# Patient Record
Sex: Male | Born: 1955 | Race: White | Hispanic: No | Marital: Married | Smoking: Former smoker
Health system: Southern US, Academic
[De-identification: ages and names within clinical notes are randomized; demographics above are authoritative.]

## PROBLEM LIST (undated history)

## (undated) DIAGNOSIS — N429 Disorder of prostate, unspecified: Secondary | ICD-10-CM

## (undated) DIAGNOSIS — M5136 Other intervertebral disc degeneration, lumbar region: Secondary | ICD-10-CM

## (undated) DIAGNOSIS — Z789 Other specified health status: Secondary | ICD-10-CM

## (undated) DIAGNOSIS — M51369 Other intervertebral disc degeneration, lumbar region without mention of lumbar back pain or lower extremity pain: Secondary | ICD-10-CM

## (undated) HISTORY — DX: Other intervertebral disc degeneration, lumbar region without mention of lumbar back pain or lower extremity pain: M51.369

## (undated) HISTORY — DX: Other intervertebral disc degeneration, lumbar region: M51.36

## (undated) HISTORY — PX: NECK SURGERY: SHX720

## (undated) HISTORY — DX: Disorder of prostate, unspecified: N42.9

---

## 1991-02-25 ENCOUNTER — Emergency Department (HOSPITAL_COMMUNITY): Payer: Self-pay

## 2022-01-04 ENCOUNTER — Encounter (INDEPENDENT_AMBULATORY_CARE_PROVIDER_SITE_OTHER): Payer: Self-pay | Admitting: Surgery

## 2022-03-01 ENCOUNTER — Ambulatory Visit (HOSPITAL_COMMUNITY): Payer: Self-pay | Admitting: PAIN MANAGEMENT

## 2022-04-05 ENCOUNTER — Emergency Department (HOSPITAL_BASED_OUTPATIENT_CLINIC_OR_DEPARTMENT_OTHER): Payer: Medicare (Managed Care)

## 2022-04-05 ENCOUNTER — Emergency Department
Admission: EM | Admit: 2022-04-05 | Discharge: 2022-04-05 | Disposition: A | Discharge status: Home / Self Care | Payer: Medicare (Managed Care) | Attending: Emergency Medicine | Admitting: Emergency Medicine

## 2022-04-05 ENCOUNTER — Emergency Department (EMERGENCY_DEPARTMENT_HOSPITAL): Payer: Medicare (Managed Care)

## 2022-04-05 ENCOUNTER — Other Ambulatory Visit: Payer: Self-pay

## 2022-04-05 ENCOUNTER — Encounter (HOSPITAL_BASED_OUTPATIENT_CLINIC_OR_DEPARTMENT_OTHER): Payer: Self-pay

## 2022-04-05 DIAGNOSIS — M47812 Spondylosis without myelopathy or radiculopathy, cervical region: Secondary | ICD-10-CM | POA: Insufficient documentation

## 2022-04-05 DIAGNOSIS — Z23 Encounter for immunization: Secondary | ICD-10-CM

## 2022-04-05 DIAGNOSIS — W19XXXA Unspecified fall, initial encounter: Secondary | ICD-10-CM

## 2022-04-05 DIAGNOSIS — S0003XA Contusion of scalp, initial encounter: Secondary | ICD-10-CM | POA: Insufficient documentation

## 2022-04-05 DIAGNOSIS — W010XXA Fall on same level from slipping, tripping and stumbling without subsequent striking against object, initial encounter: Secondary | ICD-10-CM | POA: Insufficient documentation

## 2022-04-05 DIAGNOSIS — S61012A Laceration without foreign body of left thumb without damage to nail, initial encounter: Secondary | ICD-10-CM

## 2022-04-05 DIAGNOSIS — S0990XA Unspecified injury of head, initial encounter: Secondary | ICD-10-CM

## 2022-04-05 DIAGNOSIS — M79642 Pain in left hand: Secondary | ICD-10-CM

## 2022-04-05 DIAGNOSIS — S161XXA Strain of muscle, fascia and tendon at neck level, initial encounter: Secondary | ICD-10-CM

## 2022-04-05 DIAGNOSIS — S63105A Unspecified dislocation of left thumb, initial encounter: Secondary | ICD-10-CM

## 2022-04-05 DIAGNOSIS — S39012A Strain of muscle, fascia and tendon of lower back, initial encounter: Secondary | ICD-10-CM

## 2022-04-05 HISTORY — DX: Other specified health status: Z78.9

## 2022-04-05 MED ORDER — SODIUM CHLORIDE 0.9 % INTRAVENOUS PIGGYBACK
INJECTION | INTRAVENOUS | Status: AC
Start: 2022-04-05 — End: 2022-04-05
  Filled 2022-04-05: qty 50

## 2022-04-05 MED ORDER — CYCLOBENZAPRINE 10 MG TABLET
10.0000 mg | ORAL_TABLET | Freq: Three times a day (TID) | ORAL | 0 refills | Status: DC | PRN
Start: 2022-04-05 — End: 2022-04-18

## 2022-04-05 MED ORDER — CEFAZOLIN 1 GRAM SOLUTION FOR INJECTION
INTRAMUSCULAR | Status: AC
Start: 2022-04-05 — End: 2022-04-05
  Filled 2022-04-05: qty 10

## 2022-04-05 MED ORDER — MORPHINE 4 MG/ML INJECTION WRAPPER
4.0000 mg | INJECTION | INTRAMUSCULAR | Status: AC
Start: 2022-04-05 — End: 2022-04-05
  Administered 2022-04-05: 4 mg via INTRAVENOUS

## 2022-04-05 MED ORDER — KETOROLAC 30 MG/ML (1 ML) INJECTION SOLUTION
30.0000 mg | INTRAMUSCULAR | Status: AC
Start: 2022-04-05 — End: 2022-04-05
  Administered 2022-04-05: 30 mg via INTRAVENOUS

## 2022-04-05 MED ORDER — TRAMADOL 50 MG TABLET
1.0000 | ORAL_TABLET | Freq: Four times a day (QID) | ORAL | 0 refills | Status: DC | PRN
Start: 2022-04-05 — End: 2022-04-18

## 2022-04-05 MED ORDER — METHYLPREDNISOLONE ACETATE 40 MG/ML SUSPENSION FOR INJECTION
80.0000 mg | Freq: Once | INTRAMUSCULAR | Status: AC
Start: 2022-04-05 — End: 2022-04-05
  Administered 2022-04-05: 80 mg via INTRAMUSCULAR

## 2022-04-05 MED ORDER — METHYLPREDNISOLONE ACETATE 40 MG/ML SUSPENSION FOR INJECTION
INTRAMUSCULAR | Status: AC
Start: 2022-04-05 — End: 2022-04-05
  Filled 2022-04-05: qty 2

## 2022-04-05 MED ORDER — KETOROLAC 30 MG/ML (1 ML) INJECTION SOLUTION
INTRAMUSCULAR | Status: AC
Start: 2022-04-05 — End: 2022-04-05
  Filled 2022-04-05: qty 1

## 2022-04-05 MED ORDER — DIPHTH,PERTUSSIS(ACEL),TETANUS 2.5 LF UNIT-8 MCG-5 LF/0.5ML IM SYRINGE
INJECTION | INTRAMUSCULAR | Status: AC
Start: 2022-04-05 — End: 2022-04-05
  Filled 2022-04-05: qty 0.5

## 2022-04-05 MED ORDER — BACITRACIN 500 UNIT/G OINTMENT TUBE
TOPICAL_OINTMENT | CUTANEOUS | Status: AC
Start: 2022-04-05 — End: 2022-04-05
  Filled 2022-04-05: qty 28.4

## 2022-04-05 MED ORDER — DIPHTH,PERTUSSIS(ACEL),TETANUS 2.5 LF UNIT-8 MCG-5 LF/0.5ML IM SYRINGE
0.5000 mL | INJECTION | INTRAMUSCULAR | Status: AC
Start: 2022-04-05 — End: 2022-04-05
  Administered 2022-04-05: 0.5 mL via INTRAMUSCULAR

## 2022-04-05 MED ORDER — LIDOCAINE HCL 20 MG/ML (2 %) INJECTION SOLUTION
INTRAMUSCULAR | Status: AC
Start: 2022-04-05 — End: 2022-04-05
  Filled 2022-04-05: qty 20

## 2022-04-05 MED ORDER — SODIUM CHLORIDE 0.9 % INTRAVENOUS PIGGYBACK
1.0000 g | INTRAVENOUS | Status: AC
Start: 2022-04-05 — End: 2022-04-05
  Administered 2022-04-05: 0 g via INTRAVENOUS
  Administered 2022-04-05: 1 g via INTRAVENOUS

## 2022-04-05 MED ORDER — KETOROLAC 10 MG TABLET
10.0000 mg | ORAL_TABLET | Freq: Four times a day (QID) | ORAL | 0 refills | Status: DC | PRN
Start: 2022-04-05 — End: 2022-04-18

## 2022-04-05 MED ORDER — MORPHINE 4 MG/ML INJECTION WRAPPER
INJECTION | INTRAMUSCULAR | Status: AC
Start: 2022-04-05 — End: 2022-04-05
  Filled 2022-04-05: qty 1

## 2022-04-05 MED ORDER — CEPHALEXIN 500 MG CAPSULE
500.0000 mg | ORAL_CAPSULE | Freq: Four times a day (QID) | ORAL | 0 refills | Status: AC
Start: 2022-04-05 — End: 2022-04-15

## 2022-04-05 MED ORDER — LIDOCAINE HCL 10 MG/ML (1 %) INJECTION SOLUTION
INTRAMUSCULAR | Status: AC
Start: 2022-04-05 — End: 2022-04-05
  Filled 2022-04-05: qty 20

## 2022-04-05 MED ORDER — SODIUM CHLORIDE 0.9 % INTRAVENOUS SOLUTION
INTRAVENOUS | Status: DC
Start: 2022-04-05 — End: 2022-04-05
  Administered 2022-04-05: 0 mL via INTRAVENOUS

## 2022-04-05 MED ORDER — CYCLOBENZAPRINE 10 MG TABLET
10.0000 mg | ORAL_TABLET | ORAL | Status: AC
Start: 2022-04-05 — End: 2022-04-05
  Administered 2022-04-05: 10 mg via ORAL

## 2022-04-05 MED ORDER — CYCLOBENZAPRINE 10 MG TABLET
ORAL_TABLET | ORAL | Status: AC
Start: 2022-04-05 — End: 2022-04-05
  Filled 2022-04-05: qty 1

## 2022-04-05 NOTE — ED Nurses Note (Signed)
Discharge instructions given to and went over with patient and wife. Understanding stated. Patient reports feeling better than when he came in. Ambulated off unit without problem accompanied by wife.

## 2022-04-05 NOTE — ED Provider Notes (Signed)
[facility], [facility] Emergency Department  ED Primary Provider Note  History of Present Illness   Chief Complaint   Patient presents with    Brandon Fuller is a 66 y.o. male who had concerns including Fall.  Arrival: The patient arrived by Car complaining of tripping and falling backwards hitting his head.  He is complaining of severe headache as well as neck pain.  He denies any numbness or tingling in the extremity.  Denies any loss of consciousness.  No blurred or double vision.  He states he also has no movement in his left thumb.  He said the tip of the thumb bent all way backwards and when he got up off the ground he pushed it back.  He is unable to flex the left thumb.  Patient also is sore all over.  He states he has had back trouble in his lower back for years and has been going throat a chiropractor as well as a pain clinic in [place].    HPI  Review of Systems   Review of Systems   Constitutional:  Positive for activity change and appetite change. Negative for chills and fever.   HENT:  Negative for ear pain and sore throat.    Eyes:  Negative for pain and visual disturbance.   Respiratory:  Negative for cough and shortness of breath.    Cardiovascular:  Negative for chest pain and palpitations.   Gastrointestinal:  Negative for abdominal pain and vomiting.   Genitourinary:  Negative for dysuria and hematuria.   Musculoskeletal:  Positive for arthralgias, back pain, joint swelling and myalgias.   Skin:  Positive for wound. Negative for color change and rash.   Neurological:  Positive for dizziness and headaches. Negative for seizures and syncope.   All other systems reviewed and are negative.     Historical Data   History Reviewed This Encounter:     Physical Exam   ED Triage Vitals [04/05/22 0837]   BP (Non-Invasive) 102/67   Heart Rate 60   Respiratory Rate 20   Temperature (!) 35.9 C (96.7 F)   SpO2 97 %   Weight 83.5 kg (184 lb)   Height 1.803 m ('5\' 11"'$ )      Physical Exam  Vitals and nursing note reviewed.   Constitutional:       General: He is not in acute distress.     Appearance: He is well-developed. He is obese.   HENT:      Head: Normocephalic.      Comments: Positive tenderness with mild swelling to the occiput.     Right Ear: External ear normal.      Left Ear: External ear normal.      Nose: Nose normal.      Mouth/Throat:      Mouth: Mucous membranes are moist.   Eyes:      Extraocular Movements: Extraocular movements intact.      Conjunctiva/sclera: Conjunctivae normal.      Pupils: Pupils are equal, round, and reactive to light.   Neck:      Comments: Positive tenderness over C5-C6.  No swelling or ecchymosis.  Cardiovascular:      Rate and Rhythm: Normal rate and regular rhythm.      Pulses: Normal pulses.      Heart sounds: Normal heart sounds. No murmur heard.  Pulmonary:      Effort: Pulmonary effort is normal. No respiratory distress.  Breath sounds: Normal breath sounds.   Abdominal:      General: Bowel sounds are normal.      Palpations: Abdomen is soft.      Tenderness: There is no abdominal tenderness.   Musculoskeletal:         General: Swelling, tenderness, deformity and signs of injury present.      Cervical back: Neck supple. Tenderness present.      Comments: Positive swelling of the thumb at the D IP with laceration just below of 2 cm.  Patient is unable to flex the distal phalanx.  No numbness or tingling in the tip.   Skin:     General: Skin is warm and dry.      Capillary Refill: Capillary refill takes less than 2 seconds.   Neurological:      General: No focal deficit present.      Mental Status: He is alert and oriented to person, place, and time.   Psychiatric:         Mood and Affect: Mood normal.         Behavior: Behavior normal.         Thought Content: Thought content normal.         Judgment: Judgment normal.       Patient Data   Labs Ordered/Reviewed - No data to display  XR HAND LEFT   Final Result by Edi, Radresults In  (11/06 1250)   Interval reduction and splinting of the first interphalangeal joint.                Radiologist location ID: [id number]         CT BRAIN WO IV CONTRAST   Final Result by Edi, Radresults In (11/06 1028)   NO ACUTE FINDINGS         One or more dose reduction techniques were used (e.g., Automated exposure control, adjustment of the mA and/or kV according to patient size, use of iterative reconstruction technique).         Radiologist location ID: [id number]         CT CERVICAL SPINE WO IV CONTRAST   Final Result by Edi, Radresults In (11/06 1031)   NO ACUTE CERVICAL FRACTURE.  DEGENERATIVE CHANGES.         One or more dose reduction techniques were used (e.g., Automated exposure control, adjustment of the mA and/or kV according to patient size, use of iterative reconstruction technique).         Radiologist location ID: [id number]         XR HAND LEFT   Final Result by Edi, Radresults In (11/06 0926)   A dislocation of the interphalangeal joint of the left thumb is noted. No definite associated fracture is seen.         Radiologist location ID: [facility] Decision [facility]        Medical Decision Making  Patient is 66 year old white male who fell backwards hitting his head.  He is complaining of pain to the back of the head as well as neck.  Patient also  bent his tip of his left thumb backwards and is unable to flex the distal tip.  Also has a laceration on the palmar aspect of the D IP left thumb.  Patient has no flexor movement of the distal phalanx on the thumb.  Patient will have a CT scan of his head neck.  Will also have  an x-ray of his left hand.  I will then talk to a hand surgeon concerning a ruptured flexor tendon of the thumb on the left.  Patient meantime will have tetanus shot as well as pain relief and an antibiotic for the open thumb.    Amount and/or Complexity of Data Reviewed  Radiology: ordered.    Risk  Prescription drug management.  Parenteral controlled  substances.    Lac Repair    Date/Time: 04/05/2022 12:06 PM    Performed by: Ammie Ferrier, MD  Authorized by: Ammie Ferrier, MD    Consent:     Consent obtained:  Verbal    Consent given by:  Patient    Risks, benefits, and alternatives were discussed: yes      Risks discussed:  Infection, pain, retained foreign body and need for additional repair    Alternatives discussed:  No treatment  Universal protocol:     Procedure explained and questions answered to patient or proxy's satisfaction: yes      Relevant documents present and verified: yes      Test results available: yes      Imaging studies available: yes      Required blood products, implants, devices, and special equipment available: yes      Site/side marked: yes      Immediately prior to procedure, a time out was called: yes      Patient identity confirmed:  Hospital-assigned identification number, verbally with patient and arm band  Anesthesia:     Anesthesia method:  Local infiltration    Local anesthetic:  Lidocaine 1% w/o epi  Laceration details:     Location:  Finger    Finger location:  L thumb    Length (cm):  3    Depth (mm):  1  Pre-procedure details:     Preparation:  Patient was prepped and draped in usual sterile fashion and imaging obtained to evaluate for foreign bodies  Exploration:     Limited defect created (wound extended): yes      Hemostasis achieved with:  Epinephrine    Imaging obtained: x-ray      Imaging outcome: foreign body not noted      Wound exploration: wound explored through full range of motion and entire depth of wound visualized    Treatment:     Area cleansed with:  Povidone-iodine    Amount of cleaning:  Standard    Irrigation solution:  Sterile saline    Irrigation method:  Syringe    Visualized foreign bodies/material removed: no      Debridement:  None    Undermining:  None    Scar revision: no    Skin repair:     Repair method:  Sutures    Suture size:  4-0    Suture material:  Nylon    Suture technique:  Simple  interrupted    Number of sutures:  7  Approximation:     Approximation:  Close  Repair type:     Repair type:  Simple  Post-procedure details:     Dressing:  Antibiotic ointment, sterile dressing and splint for protection    Procedure completion:  Tolerated well, no immediate complications  Upper Ext Dislocation    Date/Time: 04/05/2022 12:10 PM    Performed by: Ammie Ferrier, MD  Authorized by: Ammie Ferrier, MD    Consent:     Consent obtained:  Verbal    Consent given by:  Patient  Risks, benefits, and alternatives were discussed: yes      Risks discussed:  Restricted joint movement, fracture, nerve damage and recurrent dislocation    Alternatives discussed:  No treatment  Universal protocol:     Procedure explained and questions answered to patient or proxy's satisfaction: yes      Relevant documents present and verified: yes      Test results available: yes      Imaging studies available: yes      Required blood products, implants, devices, and special equipment available: yes      Site/side marked: yes      Immediately prior to procedure, a time out was called: yes      Patient identity confirmed:  Verbally with patient, hospital-assigned identification number and arm band  Location:     Location:  Finger    Finger location:  L thumb    Finger dislocation type: DIP    Pre-procedure details:     Pre-procedure imaging:  X-ray    Imaging findings: dislocation present      Distal perfusion: diminished    Sedation:     Sedation type:  None  Anesthesia:     Anesthesia method:  Local infiltration    Local anesthetic:  Lidocaine 1% w/o epi  Procedure details:     Manipulation performed: yes      Finger reduction method:  Direct traction    Reduction successful: yes      Reduction confirmed with imaging: yes      Immobilization:  Splint    Supplies used:  Aluminum splint  Post-procedure details:     Neurological function: normal      Distal perfusion: normal      Range of motion: improved      Procedure completion:   Tolerated well, no immediate complications               Medications Administered in the ED   NS premix infusion (0 mL Intravenous Stopped 04/05/22 1324)   diphtheria, pertussis-acell, tetanus (BOOSTRIX) IM injection (0.5 mL IntraMUSCULAR Given 04/05/22 0935)   ceFAZolin (ANCEF) 1 g in NS 50 mL IVPB minibag (0 g Intravenous Stopped 04/05/22 0953)   morphine 4 mg/mL injection (4 mg Intravenous Given 04/05/22 0932)   ketorolac (TORADOL) 30 mg/mL injection (30 mg Intravenous Given 04/05/22 1316)   cyclobenzaprine (FLEXERIL) tablet (10 mg Oral Given 04/05/22 1315)   methylPREDNISolone acetate (DEPO-medrol) 40 mg/mL injection (80 mg IntraMUSCULAR Given 04/05/22 1318)     Clinical Impression   Dislocation of left thumb (Primary)   Laceration of left thumb   Cervical strain, acute   Closed head injury   Contusion of occipital region of scalp   Lumbosacral strain, initial encounter       Disposition: Discharged               Clinical Impression   Dislocation of left thumb (Primary)   Laceration of left thumb   Cervical strain, acute   Closed head injury   Contusion of occipital region of scalp   Lumbosacral strain, initial encounter       Discharge Medication List as of 04/05/2022 12:43 PM        START taking these medications    Details   cephalexin (KEFLEX) 500 mg Oral Capsule Take 1 Capsule (500 mg total) by mouth Four times a day for 10 days, Disp-40 Capsule, R-0, E-Rx      cyclobenzaprine (FLEXERIL) 10 mg Oral Tablet Take 1 Tablet (  10 mg total) by mouth Three times a day as needed for Muscle spasms, Disp-12 Tablet, R-0, E-Rx      ketorolac tromethamine (TORADOL) 10 mg Oral Tablet Take 1 Tablet (10 mg total) by mouth Every 6 hours as needed for Pain, Disp-20 Tablet, R-0, DAW, E-Rx      traMADoL (ULTRAM) 50 mg Oral Tablet Take 1 Tablet (50 mg total) by mouth Every 6 hours as needed for Pain, Disp-20 Tablet, R-0, E-Rx

## 2022-04-05 NOTE — ED Triage Notes (Signed)
Fell backward on an bank  hit back of head no LOC  and landed on back on concrete  also bent Lt thumb backward  broken???  Also has a laceration on the posterior lt thumb  happen about 7:30am

## 2022-04-18 ENCOUNTER — Encounter (HOSPITAL_BASED_OUTPATIENT_CLINIC_OR_DEPARTMENT_OTHER): Payer: Self-pay

## 2022-04-18 ENCOUNTER — Emergency Department
Admission: EM | Admit: 2022-04-18 | Discharge: 2022-04-18 | Disposition: A | Discharge status: Home / Self Care | Payer: Medicare (Managed Care) | Attending: Emergency Medicine | Admitting: Emergency Medicine

## 2022-04-18 ENCOUNTER — Other Ambulatory Visit: Payer: Self-pay

## 2022-04-18 DIAGNOSIS — X58XXXD Exposure to other specified factors, subsequent encounter: Secondary | ICD-10-CM | POA: Insufficient documentation

## 2022-04-18 DIAGNOSIS — Z4802 Encounter for removal of sutures: Secondary | ICD-10-CM

## 2022-04-18 DIAGNOSIS — S61012D Laceration without foreign body of left thumb without damage to nail, subsequent encounter: Secondary | ICD-10-CM | POA: Insufficient documentation

## 2022-04-18 MED ORDER — BACITRACIN 500 UNIT/G OINTMENT TUBE
TOPICAL_OINTMENT | CUTANEOUS | Status: DC
Start: 2022-04-18 — End: 2022-04-18

## 2022-04-18 MED ORDER — BACITRACIN 500 UNIT/G OINTMENT TUBE
TOPICAL_OINTMENT | CUTANEOUS | Status: AC
Start: 2022-04-18 — End: 2022-04-18
  Filled 2022-04-18: qty 28.4

## 2022-04-18 NOTE — ED Nurses Note (Signed)
Pt in for suture removal. Sutures noted intact at left thumb. No redness, swelling or drainage noted. Well approximated. Capillary refill less than 2 sec. Sutures removed. Bacitracin and bandage applied. Pt to change dressing every day x 3 days. Return if any redness , swelling , fever noted. Written and verbal instructions given. Pt and wife voice understanding.

## 2022-04-18 NOTE — ED Triage Notes (Signed)
Patient states he is here for suture removal.  Has been 2 weeks since he got them.

## 2022-04-18 NOTE — ED Provider Notes (Signed)
[facility], [facility] Emergency Department  ED Primary Provider Note  History of Present Illness   Chief Complaint   Patient presents with    Laceration Recheck     Brandon Fuller is a 66 y.o. male who had concerns including Laceration Recheck.  Arrival: The patient arrived by Car patient initially had a laceration to his thumb as well as a dislocation of thumb.  Patient was relocated and then sutured 2 weeks ago.  Patient states everything has been going fine.  He denies any drainage from the wound.  No increased redness or swelling.  No fever chills.  He states being able to move the thumb in all directions with minimal pain.    HPI  Review of Systems   Review of Systems   Constitutional:  Positive for activity change. Negative for chills and fever.   HENT:  Negative for ear pain and sore throat.    Eyes:  Negative for pain and visual disturbance.   Respiratory:  Negative for cough and shortness of breath.    Cardiovascular:  Negative for chest pain and palpitations.   Gastrointestinal:  Negative for abdominal pain and vomiting.   Genitourinary:  Negative for dysuria and hematuria.   Musculoskeletal:  Positive for arthralgias and joint swelling. Negative for back pain.   Skin:  Positive for wound. Negative for color change and rash.   Neurological:  Negative for seizures and syncope.   All other systems reviewed and are negative.     Historical Data   History Reviewed This Encounter:     Physical Exam   ED Triage Vitals [04/18/22 0918]   BP (Non-Invasive) 94/65   Heart Rate 82   Respiratory Rate 17   Temperature 36.7 C (98.1 F)   SpO2 98 %   Weight 84.8 kg (187 lb)   Height 1.803 m ('5\' 11"'$ )     Physical Exam  Vitals and nursing note reviewed.   Constitutional:       General: He is not in acute distress.     Appearance: He is well-developed and normal weight.   HENT:      Head: Normocephalic and atraumatic.      Right Ear: External ear normal.      Left Ear: External ear normal.       Nose: Nose normal.      Mouth/Throat:      Mouth: Mucous membranes are moist.   Eyes:      Extraocular Movements: Extraocular movements intact.      Conjunctiva/sclera: Conjunctivae normal.      Pupils: Pupils are equal, round, and reactive to light.   Cardiovascular:      Rate and Rhythm: Normal rate and regular rhythm.      Pulses: Normal pulses.      Heart sounds: Normal heart sounds. No murmur heard.  Pulmonary:      Effort: Pulmonary effort is normal. No respiratory distress.      Breath sounds: Normal breath sounds.   Abdominal:      General: Bowel sounds are normal.      Palpations: Abdomen is soft.      Tenderness: There is no abdominal tenderness.   Musculoskeletal:         General: Tenderness present. No swelling.      Cervical back: Normal range of motion and neck supple.      Comments: Mild tenderness to the metacarpal phalanges joint on the thumb.   Skin:  General: Skin is warm and dry.      Capillary Refill: Capillary refill takes less than 2 seconds.      Findings: Lesion present.      Comments: Sutures are intact without any erythema or swelling.  No discharge from the wound.   Neurological:      General: No focal deficit present.      Mental Status: He is alert and oriented to person, place, and time.   Psychiatric:         Mood and Affect: Mood normal.         Behavior: Behavior normal.         Thought Content: Thought content normal.       Patient Data   Labs Ordered/Reviewed - No data to display  No orders to display     Medical Decision Making        Medical Decision Making  Patient is a 66 year old white male complaining here for suture removal of his wound on his thumb.  Patient states he has minimal pain to the left thumb when he moves at the phalanges metacarpal joint.  He denies any drainage from the wound.  He denies any redness or swelling.  No numbness or tingling in the extremity.  Patient will have sutures removed and then discharged home.    Risk  OTC drugs.              Medications Administered in the ED   bacitracin 500 units/gram topical ointment tube (has no administration in time range)     Clinical Impression   Encounter for removal of sutures (Primary)       Disposition: Discharged               Clinical Impression   Encounter for removal of sutures (Primary)       Current Discharge Medication List

## 2022-07-19 ENCOUNTER — Encounter (INDEPENDENT_AMBULATORY_CARE_PROVIDER_SITE_OTHER): Payer: Self-pay | Admitting: Surgery

## 2022-07-19 ENCOUNTER — Ambulatory Visit (INDEPENDENT_AMBULATORY_CARE_PROVIDER_SITE_OTHER): Payer: Medicare (Managed Care) | Admitting: Surgery

## 2022-07-19 ENCOUNTER — Other Ambulatory Visit: Payer: Medicare (Managed Care) | Attending: Surgery | Admitting: Surgery

## 2022-07-19 ENCOUNTER — Other Ambulatory Visit: Payer: Self-pay

## 2022-07-19 VITALS — BP 108/72 | HR 70 | Temp 97.3°F | Ht 71.0 in | Wt 181.0 lb

## 2022-07-19 DIAGNOSIS — L723 Sebaceous cyst: Secondary | ICD-10-CM

## 2022-07-19 DIAGNOSIS — D235 Other benign neoplasm of skin of trunk: Secondary | ICD-10-CM

## 2022-07-19 DIAGNOSIS — L728 Other follicular cysts of the skin and subcutaneous tissue: Secondary | ICD-10-CM | POA: Insufficient documentation

## 2022-07-20 ENCOUNTER — Encounter (INDEPENDENT_AMBULATORY_CARE_PROVIDER_SITE_OTHER): Payer: Self-pay | Admitting: Surgery

## 2022-07-20 NOTE — Progress Notes (Signed)
[facility]    Progress Note    Name: Brandon Fuller MRN:  [id number]   Date: 07/19/2022 Age: 67 y.o.          Date of Birth:  1955-11-06  PCP: No Pcp  Referring:  No Pcp     HPI:  Brandon Fuller is a 67 y.o. White male who returns for evaluation of recurrent sebaceous lesion on his lower back.  Had a sebaceous cyst removed by me at that location in January 2022.  Did well initially and for over a year.  Recently has developed increasing swelling at that area with an area of ulceration that has begun to drain.  No fever.  Tender to touch and with certain positions.          Past Medical History:   Diagnosis Date    DDD (degenerative disc disease), lumbar     Prostate disease       Past Surgical History:   Procedure Laterality Date    NECK SURGERY        Outpatient Medications Marked as Taking for the 07/19/22 encounter (Office Visit) with Shirlee More, MD   Medication Sig    finasteride (PROSCAR) 5 mg Oral Tablet Take 1 Tablet (5 mg total) by mouth Once a day    omeprazole (PRILOSEC) 20 mg Oral Capsule, Delayed Release(E.C.) Take 1 Capsule (20 mg total) by mouth Once a day    tadalafil (CIALIS) 5 mg Oral Tablet Take 1 Tablet (5 mg total) by mouth Every 24 hours as needed    terazosin (HYTRIN) 5 mg Oral Capsule Take 1 Capsule (5 mg total) by mouth Every night      No Known Allergies        BP 108/72   Pulse 70   Temp 36.3 C (97.3 F)   Ht 1.803 m (5' 11"$ )   Wt 82.1 kg (181 lb)   SpO2 96%   BMI 25.24 kg/m          General: appropriate for age. in no acute distress.    Vital signs are present above and have been reviewed by me     HEENT: Atraumatic, Normocephalic.    Lungs: Nonlabored breathing with symmetric expansion    Heart:Regular wth respect to rate and rythmn.    Abdomen:Soft. Nontender. Nondistended     Skin:  Inflamed sebaceous lesion lower back near the site of the previous sebaceous lesion excision.  Central area of ulceration.  Minimal expressible  drainage.  Size of lesion proximally 2.5 cm    Psychiatric: Alert and oriented to person, place, and time. affect appropriate       Assessment/Plan:  Assessment/Plan   1. Sebaceous cyst         Tolerated excision of the area in the office with some mild discomfort associated with the local anesthesia.  Suture removal in 2 weeks      This note was partially created using voice recognition software and is inherently subject to errors including those of syntax and "sound alike " substitutions which may escape proof reading. In such instances, original meaning may be extrapolated by contextual derivation.    Bridgett Larsson MD MBA CPE FACS

## 2022-07-20 NOTE — Procedures (Signed)
GENERAL SURGERY, [place] [address]  [place] [id number]    Procedure Note    Name: Brandon Fuller MRN:  [id number]   Date: 07/19/2022 Age: 67 y.o.  DOB:   [birth year]       11403 - EXC BENIGN LESION 2.1 TO 3.0 CM W/ MARGINS, EXCEPT SKIN TAG/TRUNK/ARMS/LEGS (AMB ONLY)    Performed by: Shirlee More, MD  Authorized by: Shirlee More, MD    Time Out:     Immediately before the procedure, a time out was called:  Yes    Patient verified:  Yes    Procedure Verified:  Yes    Site Verified:  Yes  Documentation:      The lower back was prepped and draped in usual sterile fashion followed by injection of local analgesia.   The area of concern was then excised with visibly clear margins with hemostasis ensured.  The preoperative size of the lesion was 2.5 centimeters.  The wound was closed with interrupted 4-0 nylon sutures.  A sterile pressure dressing was applied.     Bridgett Larsson MD MBA CPE FACS        Shirlee More, MD

## 2022-07-21 DIAGNOSIS — L720 Epidermal cyst: Secondary | ICD-10-CM

## 2022-07-21 LAB — SURGICAL PATHOLOGY SPECIMEN

## 2022-07-26 ENCOUNTER — Other Ambulatory Visit: Payer: Self-pay

## 2022-07-26 ENCOUNTER — Encounter (INDEPENDENT_AMBULATORY_CARE_PROVIDER_SITE_OTHER): Payer: Self-pay | Admitting: Surgery

## 2022-07-26 ENCOUNTER — Ambulatory Visit (INDEPENDENT_AMBULATORY_CARE_PROVIDER_SITE_OTHER): Payer: Medicare (Managed Care) | Admitting: Surgery

## 2022-07-26 VITALS — BP 111/65 | HR 74 | Temp 97.4°F | Resp 18 | Ht 71.0 in | Wt 179.0 lb

## 2022-07-26 DIAGNOSIS — Z9889 Other specified postprocedural states: Secondary | ICD-10-CM

## 2022-07-26 DIAGNOSIS — L723 Sebaceous cyst: Secondary | ICD-10-CM

## 2022-07-26 NOTE — Progress Notes (Unsigned)
[facility]    Progress Note    Name: Brandon Fuller MRN:  [id number]   Date: 07/26/2022 Age: 67 y.o.          Date of Birth:  02-24-1956  PCP: No Pcp  Referring:  No ref. provider found     HPI:  Brandon Fuller is a 67 y.o. White male who returns following sebaceous cyst removal by me last week.  Did well initially but then subsequently has had some drainage from the incision site.  Describes it as mostly clear but some pinkish discoloration at times.  No fever.  No increased pain        Past Medical History:   Diagnosis Date    DDD (degenerative disc disease), lumbar     Prostate disease       Past Surgical History:   Procedure Laterality Date    NECK SURGERY        No outpatient medications have been marked as taking for the 07/26/22 encounter (Appointment) with Shirlee More, MD.      No Known Allergies        There were no vitals taken for this visit.         General: appropriate for age. in no acute distress.    Vital signs are present above and have been reviewed by me     Incision site with a small probable hematoma within the pocket.  Not fluctuant.  No surrounding erythema.  No significant expressible drainage with manipulation by me.    Assessment/Plan:  Assessment/Plan   1. Sebaceous cyst         Probable small hematoma which is going to resolve without intervention.  Does not require any other treatment at this time.  Does not appear to be infected.  Will observe for now and recheck him again next week prior to removing his sutures  This note was partially created using voice recognition software and is inherently subject to errors including those of syntax and "sound alike " substitutions which may escape proof reading. In such instances, original meaning may be extrapolated by contextual derivation.    Bridgett Larsson MD MBA CPE FACS

## 2022-07-28 ENCOUNTER — Encounter (INDEPENDENT_AMBULATORY_CARE_PROVIDER_SITE_OTHER): Payer: Self-pay | Admitting: Surgery

## 2022-07-30 ENCOUNTER — Ambulatory Visit (INDEPENDENT_AMBULATORY_CARE_PROVIDER_SITE_OTHER): Payer: Self-pay

## 2022-08-06 ENCOUNTER — Other Ambulatory Visit: Payer: Self-pay

## 2022-08-06 ENCOUNTER — Ambulatory Visit (INDEPENDENT_AMBULATORY_CARE_PROVIDER_SITE_OTHER): Payer: Medicare (Managed Care)

## 2022-08-06 ENCOUNTER — Telehealth (INDEPENDENT_AMBULATORY_CARE_PROVIDER_SITE_OTHER): Payer: Self-pay | Admitting: Surgery

## 2022-08-06 DIAGNOSIS — Z4802 Encounter for removal of sutures: Secondary | ICD-10-CM

## 2022-08-06 NOTE — Telephone Encounter (Signed)
Informed wife of results voiced understanding Arvella Nigh, LPN  [phone]

## 2022-08-06 NOTE — Telephone Encounter (Signed)
Patient in office for suture removal. Requesting results from cyst removal. Please advise. Arvella Nigh, LPN  [phone]

## 2022-08-06 NOTE — Nursing Note (Signed)
Sutures to right lower back removed without difficulty tolerated well. Arvella Nigh, LPN  [phone]

## 2022-08-20 IMAGING — MR MRI SHOULDER RT W/O CONTRAST
6 series · 38 of 40 positions shown · IV contrast (gadolinium)
Comparison: None available.

﻿EXAM:  73775   MRI SHOULDER RT W/O CONTRAST
INDICATION: 66-year-old with chronic right shoulder pain with diminished range of motion.  No history of shoulder surgery.
TECHNIQUE: Multiplanar multisequential MRI of the right shoulder was performed without gadolinium contrast.

[Series 6: T1 · oblique · right · 3.5mm · 0.33mm/px · 7 of 18 slices shown]
[im 1/18]
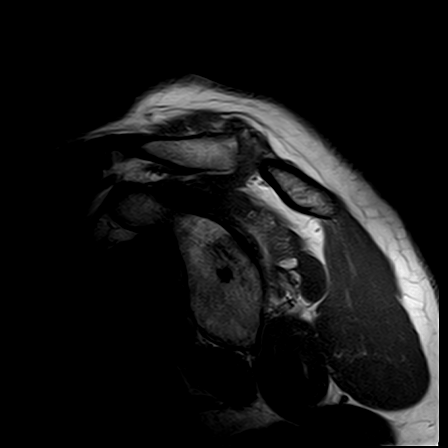
[im 3/18]
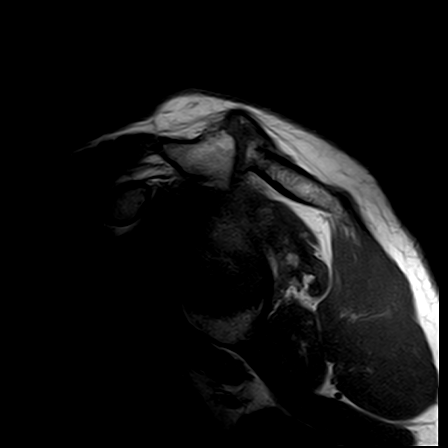
[im 6/18]
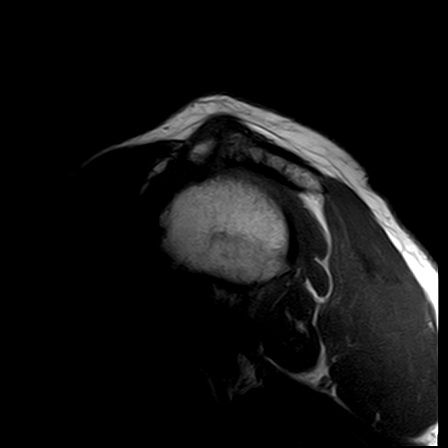
[im 9/18]
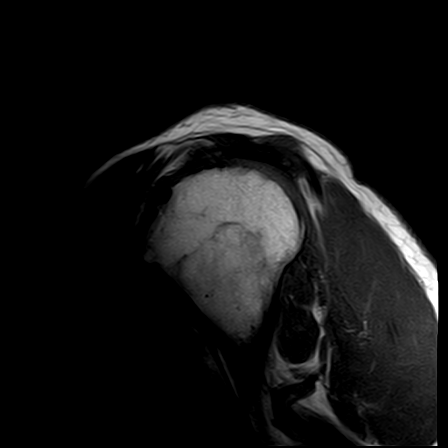
[im 12/18]
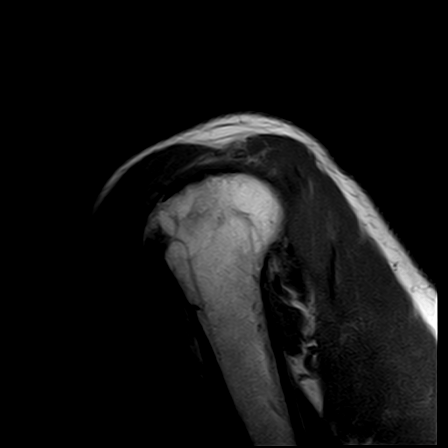
[im 15/18]
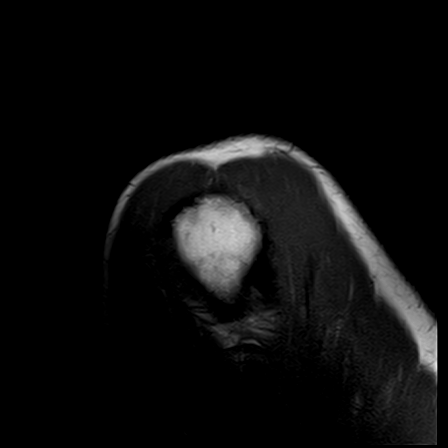
[im 18/18]
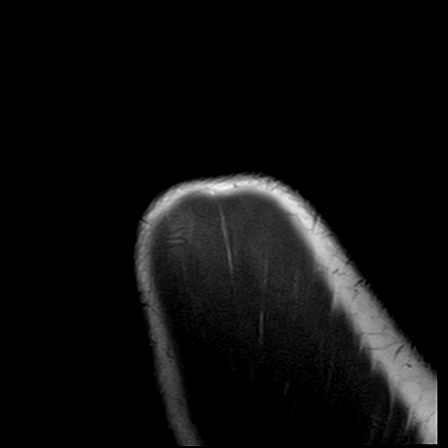

[Series 7: STIR · oblique · right · 3.5mm · 0.47mm/px · 7 of 18 slices shown (1 of 2)]
[im 1/18]
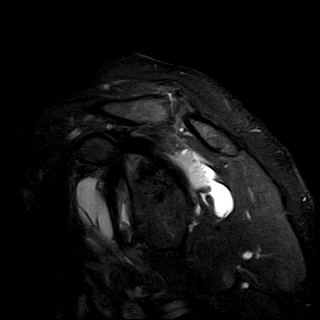
[im 3/18]
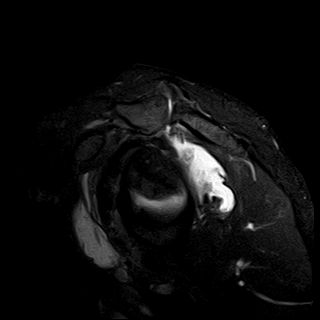
[im 6/18]
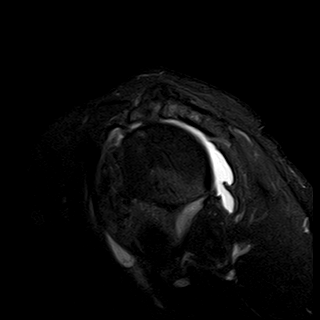
[im 9/18]
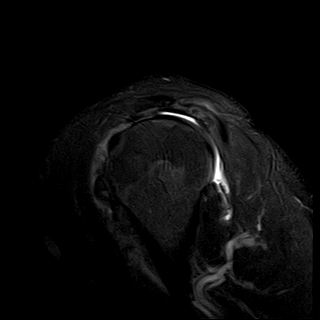
[im 12/18]
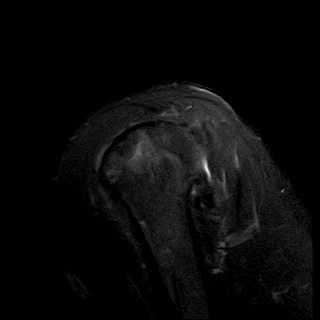
[im 15/18]
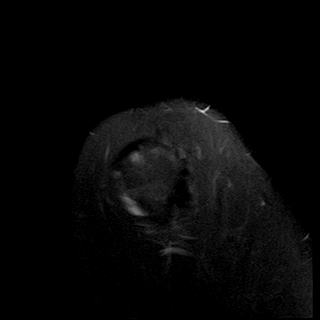
[im 18/18]
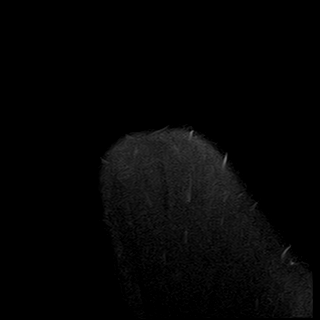

[Series 11: STIR · oblique · right · 3.5mm · 0.47mm/px · 6 of 18 slices shown (2 of 2)]
[im 1/18]
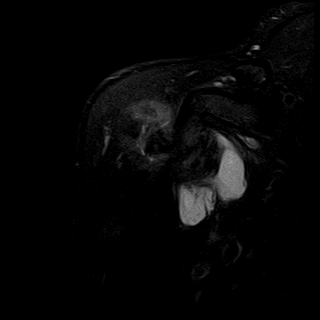
[im 4/18]
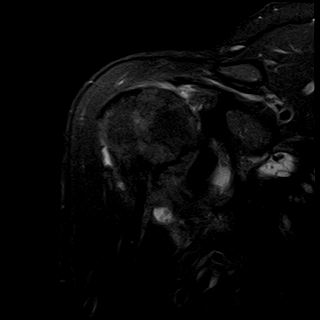
[im 7/18]
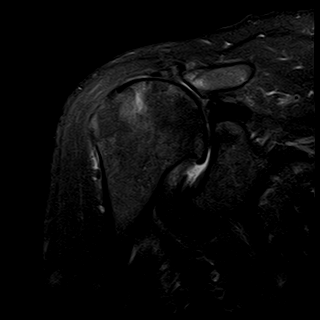
[im 11/18]
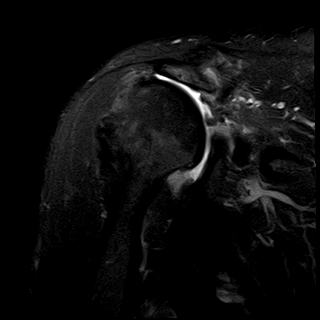
[im 14/18]
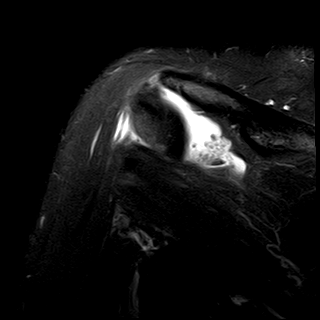
[im 18/18]
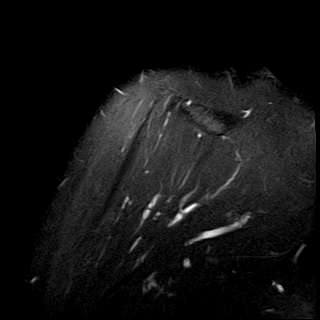

[Series 12: PD · oblique · right · 3.5mm · 0.47mm/px · 6 of 18 slices shown (1 of 2)]
[im 1/18]
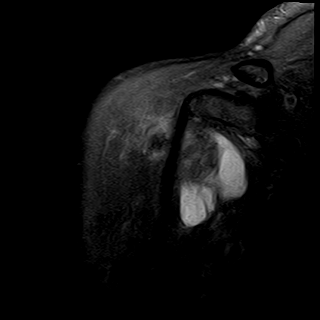
[im 4/18]
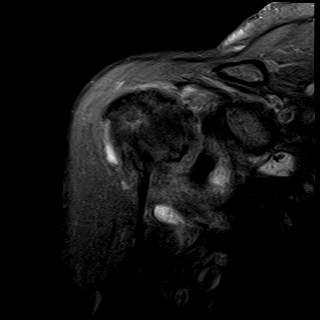
[im 7/18]
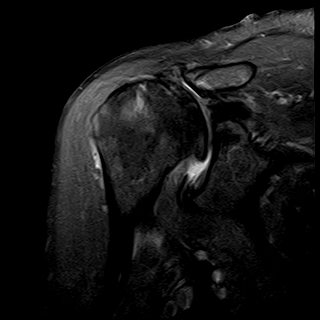
[im 11/18]
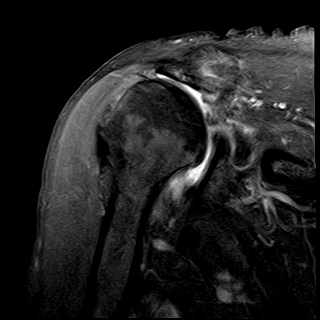
[im 14/18]
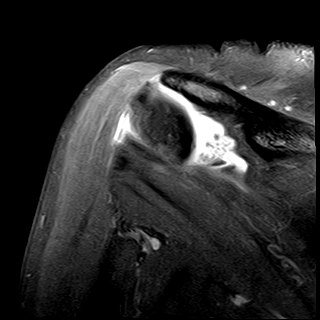
[im 18/18]
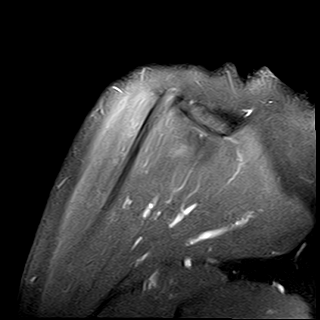

[Series 13: T2 · axial · right · 4.0mm · 0.42mm/px · z∈[+18,+88]mm · 6 of 24 slices shown]
[im 1/24]
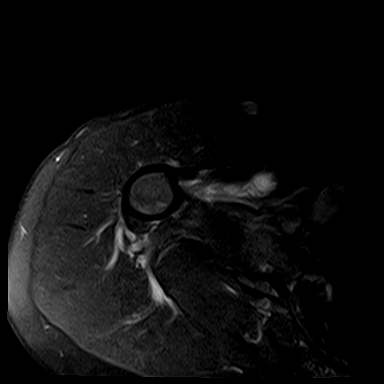
[im 4/24]
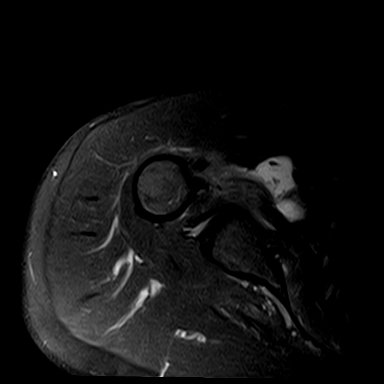
[im 7/24]
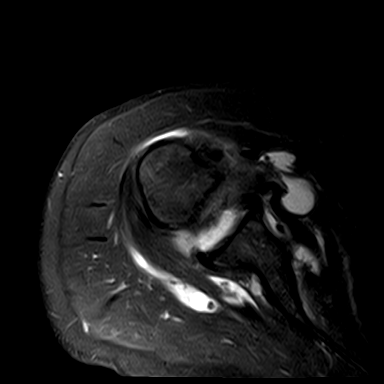
[im 10/24]
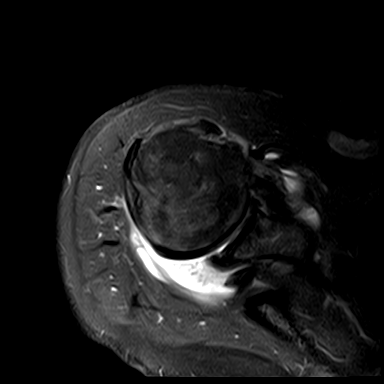
[im 14/24]
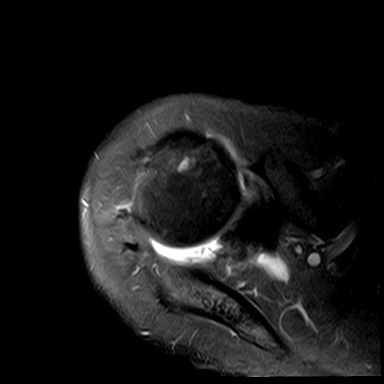
[im 17/24]
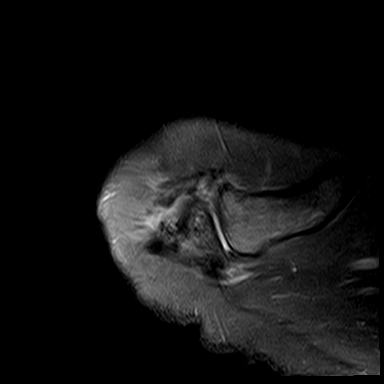

[Series 14: PD · axial · right · 4.0mm · 0.50mm/px · z∈[+31,+105]mm · 6 of 18 slices shown (2 of 2)]
[im 1/18]
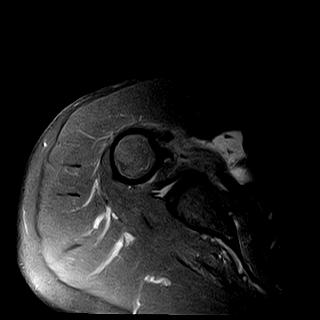
[im 4/18]
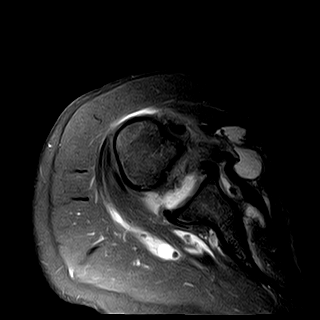
[im 7/18]
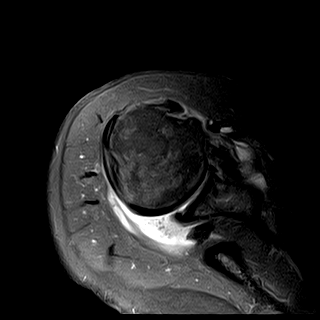
[im 11/18]
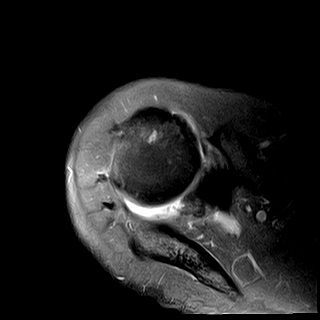
[im 14/18]
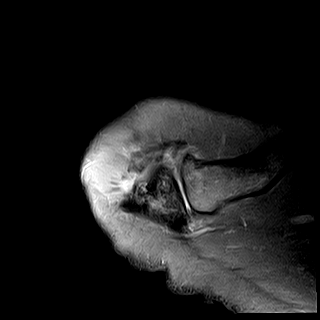
[im 18/18]
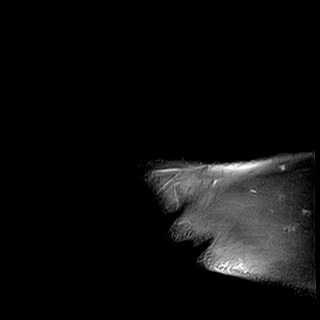

[38 of 40 positions shown; findings below may reference images not displayed]

FINDINGS: No acute bony lesions of the right shoulder are seen.  

However, there is evidence of severe retracted full thickness, full width tear of the supraspinatus tendon and superior infraspinatus tendon.  Significant tendinopathy of subscapularis tendon with partial thickness tear of distal subscapularis tendon is noted. 

Severe degenerative changes of glenohumeral joint are noted.  Significance superior subluxation of the glenohumeral joint due to rotator cuff tear is noted.  A large effusion in the glenohumeral joint extending to the subacromian bursa with multiple small osteochondral loose bodies and debris in the fluid are noted.  

Mild atrophy of the supraspinatus muscle and superior infraspinatus muscle are noted.
IMPRESSION: 1. Significant full thickness, full width tear of supraspinatus tendon with retracted tendon is noted along with full thickness tear of the superior infraspinatus tendon. 

2. Significant tendinopathy of subscapularis tendon with partial thickness tear of distal subscapularis tendon.  

3. Degenerative changes and superior subluxation of the glenohumeral joint.  

4. A large effusion in the subacromian bursa and glenohumeral joint with extensive osteochondral debris and loose bodies in the fluid.  

5. Non-visualization of proximal long head of biceps tendon.

## 2022-09-16 ENCOUNTER — Emergency Department
Admission: EM | Admit: 2022-09-16 | Discharge: 2022-09-16 | Disposition: A | Discharge status: Home / Self Care | Payer: Medicare (Managed Care) | Attending: Student in an Organized Health Care Education/Training Program | Admitting: Student in an Organized Health Care Education/Training Program

## 2022-09-16 ENCOUNTER — Other Ambulatory Visit: Payer: Self-pay

## 2022-09-16 ENCOUNTER — Encounter (HOSPITAL_COMMUNITY): Payer: Self-pay | Admitting: Student in an Organized Health Care Education/Training Program

## 2022-09-16 DIAGNOSIS — S30863A Insect bite (nonvenomous) of scrotum and testes, initial encounter: Secondary | ICD-10-CM | POA: Insufficient documentation

## 2022-09-16 DIAGNOSIS — W57XXXA Bitten or stung by nonvenomous insect and other nonvenomous arthropods, initial encounter: Secondary | ICD-10-CM | POA: Insufficient documentation

## 2022-09-16 MED ORDER — DOXYCYCLINE HYCLATE 100 MG TABLET
ORAL_TABLET | ORAL | Status: AC
Start: 2022-09-16 — End: 2022-09-16
  Filled 2022-09-16: qty 1

## 2022-09-16 MED ORDER — DOXYCYCLINE HYCLATE 100 MG TABLET
200.0000 mg | ORAL_TABLET | ORAL | Status: AC
Start: 2022-09-16 — End: 2022-09-16
  Administered 2022-09-16: 200 mg via ORAL

## 2022-09-16 NOTE — Discharge Instructions (Signed)
Keep area clean and dry.  Evaluate area tick bite daily for increased swelling drainage or redness.  You were given 1 dose of doxycycline emergency department.  Follow up with PCP for close ED follow-up.  Return emergency department for new or worsening symptoms that concern you.    Thank you for allowing [place] to be part of your care.    Please discuss all medications with your pharmacist to ensure there are no concerns of interactions.    Please ensure all questions or concerns are addressed prior to leaving the hospital. We want to make sure your concerns are addressed to make sure you are as safe and healthy as possible. By leaving the hospital, it is understood you are in agreement with your treatment plan.    You may have received sedating medication during your visit. Please discuss this with your discharging provider nurse as you may not be able to operate machines while the medication is in your system, or while you are taking any potentially sedating prescriptions.    Please call the hospital medical records office for a copy of your finalized results, and review them with a primary care physician, for any findings needing further attention.    If you feel your situation worsens, or does not get better in 48 hours, please see a physician for evaluation.    We encourage you to see your regular doctor as soon as possible to let them know you were seen in the emergency department. They may want to do further testing. If you do not have a doctor, please feel free to call the hospital, and ask for contact information of accepting providers. Please also discuss your vaccinations, and ensure all are up to date.    You may use this document to take today off work or school.

## 2022-09-16 NOTE — ED Triage Notes (Signed)
Patient reports he has a tick embedded and it needs removed. He tried to do it himself but has been unsuccessful. Tick bite is in private area and only will see a male provider. Will not see a male.

## 2022-09-16 NOTE — ED APP Handoff Note (Signed)
[place] [facility]  Emergency Department  Provider in Triage Note    Name: Brandon Fuller  Age: 67 y.o.  Gender: male     Subjective:   Brandon Fuller is a 67 y.o. male who presents with complaint of Tick Removal  .  Patient here with c/o tick bite to "a private place." Requests male doctor     Objective:   Filed Vitals:    09/16/22 1452   BP: 112/81   Pulse: 96   Resp: 18   Temp: 36.6 C (97.8 F)   SpO2: 97%        Assessment:  A medical screening exam was completed.  This patient is a 67 y.o. male with initial findings showing skin doctor    Plan:  Please see initial orders and work-up below.  This is to be continued with full evaluation in the main Emergency Department.     No current facility-administered medications for this encounter.     No results found for this or any previous visit (from the past 24 hour(s)).     Sherlie Ban, FNP-BC  09/16/2022, 14:51

## 2022-09-16 NOTE — ED Nurses Note (Signed)
Patient discharged home with family.  AVS reviewed with patient/care giver.  A written copy of the AVS and discharge instructions was given to the patient/care giver.  Questions sufficiently answered as needed.  Patient/care giver encouraged to follow up with PCP as indicated.  In the event of an emergency, patient/care giver instructed to call 911 or go to the nearest emergency room.

## 2022-09-16 NOTE — ED Provider Notes (Signed)
[place] [facility]  ED Primary Provider Note  History of Present Illness   Chief Complaint   Patient presents with    Tick Removal    Rash     Brandon Fuller is a 67 y.o. male who had concerns including Tick Removal and Rash.  Arrival: The patient arrived by Car    67 Year old male arrives today requesting removal of the head of the tick from the right testicle.  Patient reports history of the same a year ago.  Patient reports he was take himself but states head came off.  Patient was unsure how long take been present for states that there was some minimal engorgement noted.  Patient denies any recent antibiotic use.  Patient denies fever, chills, chest pain, shortness of breath, abdominal pain, nausea, vomiting, diarrhea, arthralgia.      History Reviewed This Encounter: Medical History  Surgical History  Family History  Social History    Physical Exam   ED Triage Vitals [09/16/22 1452]   BP (Non-Invasive) 112/81   Heart Rate 96   Respiratory Rate 18   Temperature 36.6 C (97.8 F)   SpO2 97 %   Weight 80.7 kg (178 lb)   Height 1.803 m ( )     Physical Exam  HENT:      Head: Normocephalic and atraumatic.   Cardiovascular:      Rate and Rhythm: Normal rate and regular rhythm.      Pulses: Normal pulses.      Heart sounds: Normal heart sounds.   Pulmonary:      Effort: Pulmonary effort is normal.      Breath sounds: Normal breath sounds.   Genitourinary:      Musculoskeletal:         General: Normal range of motion.   Skin:     General: Skin is warm.      Capillary Refill: Capillary refill takes less than 2 seconds.   Neurological:      General: No focal deficit present.      Mental Status: He is alert and oriented to person, place, and time. Mental status is at baseline.   Psychiatric:         Mood and Affect: Mood normal.         Behavior: Behavior normal.         Thought Content: Thought content normal.         Judgment: Judgment normal.       Patient Data   Labs Ordered/Reviewed -  No data to display  No orders to display     Medical Decision Making        Medical Decision Making  Evaluation patient has had of a tick imbedded into right if your testicle area.  Curved Kelly clamps were used to remove the head of tick.  Upon evaluation there was no further part of the tick retained.  Patient reported it was unknown how long the tick has been in police there was some engorgement to the take.  Discussed options of prophylactic doxycycline which the patient wanted to proceed with.  Patient was encouraged to continue to evaluate area of tick bite.  Patient was given strict ED return precautions.  All questions concerns addressed.    Risk  Prescription drug management.                Medications Administered in the ED   doxycycline tablet (200 mg Oral Given 09/16/22 1600)  Clinical Impression   Tick bite with subsequent removal of tick (Primary)       Disposition: Discharged

## 2022-11-02 ENCOUNTER — Other Ambulatory Visit (HOSPITAL_COMMUNITY): Payer: Self-pay | Admitting: Student in an Organized Health Care Education/Training Program

## 2022-11-02 DIAGNOSIS — M25511 Pain in right shoulder: Secondary | ICD-10-CM

## 2022-11-02 DIAGNOSIS — M75101 Unspecified rotator cuff tear or rupture of right shoulder, not specified as traumatic: Secondary | ICD-10-CM

## 2022-11-15 ENCOUNTER — Inpatient Hospital Stay
Admission: RE | Admit: 2022-11-15 | Discharge: 2022-11-15 | Disposition: A | Discharge status: Home / Self Care | Payer: Medicare (Managed Care) | Source: Ambulatory Visit | Attending: Student in an Organized Health Care Education/Training Program | Admitting: Student in an Organized Health Care Education/Training Program

## 2022-11-15 ENCOUNTER — Other Ambulatory Visit: Payer: Self-pay

## 2022-11-15 ENCOUNTER — Encounter (HOSPITAL_COMMUNITY): Payer: Self-pay | Admitting: Student in an Organized Health Care Education/Training Program

## 2022-11-15 ENCOUNTER — Other Ambulatory Visit (HOSPITAL_COMMUNITY): Payer: Medicare (Managed Care)

## 2022-11-15 ENCOUNTER — Other Ambulatory Visit (HOSPITAL_COMMUNITY): Payer: Self-pay | Admitting: Student in an Organized Health Care Education/Training Program

## 2022-11-15 DIAGNOSIS — M12811 Other specific arthropathies, not elsewhere classified, right shoulder: Secondary | ICD-10-CM

## 2022-11-15 DIAGNOSIS — M75101 Unspecified rotator cuff tear or rupture of right shoulder, not specified as traumatic: Secondary | ICD-10-CM

## 2022-11-15 DIAGNOSIS — M75102 Unspecified rotator cuff tear or rupture of left shoulder, not specified as traumatic: Secondary | ICD-10-CM | POA: Insufficient documentation

## 2022-11-15 DIAGNOSIS — M25511 Pain in right shoulder: Secondary | ICD-10-CM | POA: Insufficient documentation

## 2022-11-15 DIAGNOSIS — M12812 Other specific arthropathies, not elsewhere classified, left shoulder: Secondary | ICD-10-CM | POA: Insufficient documentation

## 2022-12-20 ENCOUNTER — Inpatient Hospital Stay
Admission: RE | Admit: 2022-12-20 | Discharge: 2022-12-20 | Disposition: A | Discharge status: Home / Self Care | Payer: Medicare (Managed Care) | Source: Ambulatory Visit | Attending: Student in an Organized Health Care Education/Training Program | Admitting: Student in an Organized Health Care Education/Training Program

## 2022-12-20 ENCOUNTER — Other Ambulatory Visit (HOSPITAL_COMMUNITY): Payer: Medicare (Managed Care)

## 2022-12-20 ENCOUNTER — Other Ambulatory Visit (HOSPITAL_COMMUNITY): Payer: Self-pay | Admitting: Student in an Organized Health Care Education/Training Program

## 2022-12-20 ENCOUNTER — Other Ambulatory Visit: Payer: Self-pay

## 2022-12-20 DIAGNOSIS — Z01818 Encounter for other preprocedural examination: Secondary | ICD-10-CM

## 2022-12-20 LAB — COMPREHENSIVE METABOLIC PANEL, NON-FASTING
ALBUMIN/GLOBULIN RATIO: 1.3 (ref 0.8–1.4)
ALBUMIN: 4.1 g/dL (ref 3.5–5.7)
ALKALINE PHOSPHATASE: 70 U/L (ref 34–104)
ALT (SGPT): 16 U/L (ref 7–52)
ANION GAP: 7 mmol/L (ref 4–13)
AST (SGOT): 17 U/L (ref 13–39)
BILIRUBIN TOTAL: 0.7 mg/dL (ref 0.3–1.2)
BUN/CREA RATIO: 13 (ref 6–22)
BUN: 12 mg/dL (ref 7–25)
CALCIUM, CORRECTED: 9.4 mg/dL (ref 8.9–10.8)
CALCIUM: 9.5 mg/dL (ref 8.6–10.3)
CHLORIDE: 104 mmol/L (ref 98–107)
CO2 TOTAL: 25 mmol/L (ref 21–31)
CREATININE: 0.96 mg/dL (ref 0.60–1.30)
ESTIMATED GFR: 87 mL/min/{1.73_m2} (ref >59)
GLOBULIN: 3.2 (ref 2.9–5.4)
GLUCOSE: 93 mg/dL (ref 74–109)
OSMOLALITY, CALCULATED: 271 mOsm/kg (ref 270–290)
POTASSIUM: 4 mmol/L (ref 3.5–5.1)
PROTEIN TOTAL: 7.3 g/dL (ref 6.4–8.9)
SODIUM: 136 mmol/L (ref 136–145)

## 2022-12-20 LAB — CBC
HCT: 45.8 % (ref 36.7–47.1)
HGB: 15.7 g/dL (ref 12.5–16.3)
MCH: 33 pg (ref 23.8–33.4)
MCHC: 34.3 g/dL (ref 32.5–36.3)
MCV: 96.2 fL (ref 73.0–96.2)
MPV: 8.1 fL (ref 7.4–11.4)
PLATELETS: 167 10*3/uL (ref 140–440)
RBC: 4.76 10*6/uL (ref 4.06–5.63)
RDW: 13 % (ref 12.1–16.2)
WBC: 10.6 10*3/uL — ABNORMAL HIGH (ref 3.6–10.2)

## 2022-12-20 LAB — HGA1C (HEMOGLOBIN A1C WITH EST AVG GLUCOSE): HEMOGLOBIN A1C: 5 % (ref 4.0–6.0)

## 2022-12-22 DIAGNOSIS — Z01818 Encounter for other preprocedural examination: Secondary | ICD-10-CM

## 2022-12-22 LAB — ECG 12 LEAD
Atrial Rate: 72 {beats}/min
Calculated P Axis: 66 degrees
Calculated R Axis: 50 degrees
Calculated T Axis: 16 degrees
PR Interval: 192 ms
QRS Duration: 104 ms
QT Interval: 384 ms
QTC Calculation: 420 ms
Ventricular rate: 72 {beats}/min

## 2023-04-07 ENCOUNTER — Other Ambulatory Visit (HOSPITAL_COMMUNITY): Payer: Self-pay | Admitting: Student in an Organized Health Care Education/Training Program

## 2023-04-07 DIAGNOSIS — M19019 Primary osteoarthritis, unspecified shoulder: Secondary | ICD-10-CM

## 2023-04-29 ENCOUNTER — Inpatient Hospital Stay
Admission: RE | Admit: 2023-04-29 | Discharge: 2023-04-29 | Disposition: A | Discharge status: Home / Self Care | Payer: Medicare (Managed Care) | Source: Ambulatory Visit | Attending: Student in an Organized Health Care Education/Training Program | Admitting: Student in an Organized Health Care Education/Training Program

## 2023-04-29 ENCOUNTER — Other Ambulatory Visit: Payer: Self-pay

## 2023-04-29 DIAGNOSIS — M19019 Primary osteoarthritis, unspecified shoulder: Secondary | ICD-10-CM | POA: Insufficient documentation

## 2023-07-18 ENCOUNTER — Encounter (HOSPITAL_COMMUNITY): Payer: Self-pay

## 2023-07-18 ENCOUNTER — Other Ambulatory Visit (HOSPITAL_COMMUNITY): Payer: Self-pay | Admitting: Student in an Organized Health Care Education/Training Program

## 2023-07-18 ENCOUNTER — Ambulatory Visit (HOSPITAL_COMMUNITY)
Admission: RE | Admit: 2023-07-18 | Discharge: 2023-07-18 | Disposition: A | Discharge status: Still In Hospital | Payer: Medicare (Managed Care) | Source: Ambulatory Visit | Attending: Student in an Organized Health Care Education/Training Program | Admitting: Student in an Organized Health Care Education/Training Program

## 2023-07-18 ENCOUNTER — Other Ambulatory Visit: Payer: Self-pay

## 2023-07-18 DIAGNOSIS — G8929 Other chronic pain: Secondary | ICD-10-CM | POA: Insufficient documentation

## 2023-07-18 DIAGNOSIS — M75101 Unspecified rotator cuff tear or rupture of right shoulder, not specified as traumatic: Secondary | ICD-10-CM

## 2023-07-18 NOTE — PT Evaluation (Signed)
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 Patient reports a few years ago he slipped and fell at work resulting in a torn biceps. Patient reports significant weakness and pain with activity. Patient notes pain and limitations with all left shoulder movements. Patient reports he is right handed. Patient reports left shoulder symptoms began to become unbearable within the last 1-2 years. Patient reports painful pops with shoulder movement and slight catching.    PLOF: Independent with ADLs and IADLs.     Previous episodes/treatments: Patient denies previous treatment for left shoulder pain.     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            Pain description: SHARP    Pain frequency:   INTERMITTENT    Pain rating: Now 0/10   Best 0/10    Worst 8/10    Radiculopathy: Patient reports upper arm pain    Pain increases with: POSITION CHANGE, ADLs, ACTIVITY, EXERCISE, and LIFTING           decreases with : MEDICATION    Sensation: Patient denies impairments    Weakness: Patient reports left shoulder weakness     Sleep affected: Patient reports difficulty, sleeping due to left shoulder pain. Patient reports he is unable to lay on his left side.     Subjective Functional Reports:    Sitting: WFL    Standing: LIMITED    Walking: LIMITED    Lifting: LIMITED      Patient-Specific Functional Score:    Problem Score   1. Reaching up in cabinet  6   2.Lifting a gallon of milk  0   TOTAL 3   Total score = sum of the activity scores/number of activities    Minimal detectable change (90% CI) for avg score = 2 points    Minimal detectable change (90% CI) for single activity score = 3 points       OBJECTIVE    Shoulder AROM   right left   Flexion 80 90   Extension 60 40   Abduction  75 55   ER C7 C7   IR Iliac Crest L4    ROM comment: Patient notes significant pain with all left shoulder AROM assessments. Patient also notes significant pain with guarding and limitations observed with PROM.     Strength (defined as __/5 based on 0/5 - 5/5 grading system)     right left   Shoulder flexion 3+/5 3+/5   Shoulder abduction  3+/5 3+/5   Shoulder IR 4-/5 3/5   Shoulder ER 4-/5 3/5   Elbow flexion  4-/5 3+/5   Elbow extension  4-/5 3+/5      Strength comment: Patient reports significant pain with all shoulder MMT.     Joint mobility: Unable to assess due to pain     Palpation: Patient TTP globally left shoulder    Posture: forward head, rounded shoulders    Special tests:     Shoulder special tests:      ER Lag sign: POSITIVE LEFT    NEER'S SIGN: POSITIVE LEFT    KENNEDY HAWKINS TEST: POSITIVE LEFT    Supraspinatus Localization: POSITIVE LEFT    Special testing limited at this time due to patient high pain presentation and  guarding.     Treatment provided:REVIEW OF POC AND GOALS WITH PATIENT, ALL QUESTIONS ANSWERED, PATIENT EDUCATION, and THERAPEUTIC EXERCISE     HEP Access Code: [email or web address]  URL: [email or web address]  Date: 07/18/2023  Prepared by: Doy Mince    Exercises  - Supine Shoulder Flexion Extension AAROM with Dowel  - 1-2 x daily - 7 x weekly - 1 sets - 10 reps - 10 seconds hold  - Seated Shoulder External Rotation AAROM with Cane and Hand in Neutral  - 1-2 x daily - 7 x weekly - 1 sets - 10 reps - 10 seconds hold  - Standing Shoulder Abduction AAROM with Dowel  - 1-2 x daily - 7 x weekly - 1 sets - 10 reps - 10 seconds hold    EXERCISE/ACTIVITY NAME REPETITIONS RESISTANCE COMPLETED THIS DOS   HEP Review    Y   PROM L shoulder all planes   Y    Wand flexion supine   Next   Wand ER seated   Next   Wand ABD standing   Next   Pulley flexion   Next   Gentle isometrics 4-way   Next             ASSESSMENT    Impression: Patient is a 68 year old male 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 Static assessment revealed: forward head, rounded shoulders, keeps LUE in a guarded position. Dynamic assessment revealed: significant impairment to left shoulder A/PROM, impaired LUE gross muscle strength, TTP throughout left shoulder and high pain presentation. Special testing limited at this time due to patient high reports of pain. Patient with visible and verbal expression of pain throughout examination. Prognosis is guarded at this time pending patient tolerance to PT intervention. Patient will benefit from skilled PT to reduce subjective complaints, improve pain free left shoulder A/PROM, improve left shoulder gross muscle strength, and improve tolerance to functional reaching in order to optimize functional mobility and maximize functional independence.      Rehab potential: GUARDED    Goals:     Short-Term Goals 2 Weeks:     - Patient will be independent with progressive HEP to maximize gains from PT.     - Patient  will report less than max pain of 6/10 to  aid with participation in physical therapy.     - Patient will exhibit proximal stability of the involved extremity to [facility] to permit normal posturing of the UE at rest.     - Patient will demonstrate improved left shoulder AROM to at least 105 degrees to aid in functional reaching.           Long-Term Goals: 4 Weeks:     - Patient will improve functional ability with Patient Specific Functional Scale score of at least 5.     - Patient will demonstrate improved left shoulder AROM to at least 120 degrees flexion and ABD to aid in ability to perform ADLs independently.     - Patient will demonstrate improved left shoulder strength to at least 4/5 throughout to aid in return to previous level of function.     - Patient will exhibit WFL proximal stability of left shoulder girdle for pain free axial loading during transitions.     - Patient will report sleep not disrupted by shoulder pain to allow participation of functional ADLs during the day.             PLAN  Patient will attend 2 times per week x 4 weeks. Therapy may include, but is not limited to THERAPEUTIC EXERCISES, MYOFASCIAL/JOINT MOBILIZATION, POSTURE/BODY MECHANICS, HOME INSTRUCTIONS, HEAT/COLD, and KINESIOTAPE    Plan for next visit: review tolerance to HEP, progress per flowsheet as tolerated.        Evaluation complexity:   Personal factors impacting POC: FREQUENT OR CHRONIC PAIN and PRE-EXISTING FUNCTIONAL LIMITATIONS   Co-morbidities impacting POC:  None  Complexity of physical exam: INCLUDING MUSCULOSKELETAL SYSTEM (POSTURE, ROM, STRENGTH, HEIGHT/WEIGHT), INCLUDING NEUROMUSCULAR EXAM (BALANCE, GAIT, LOCOMOTION, MOBILITY), and INCLUDING ACTIVITY/MOBILITY RESTRICTIONS   Clinical Presentation: STABLE   Evaluation Complexity: LOW-HISTORY 0, EXAMINATION 1-2, STABLE PRESENTATION        Total Session Time 60, Timed code minutes 15, and Untimed code minutes 45        Intervention minutes: EVALUATION 45 minutes and  THERAPEUTIC EXERCISE 15 minutes    Doy Mince, PT  07/18/2023, 08:39         Certification:    From:______  Through:_________    I certify the need for these services furnished under this plan of treatment and while under my care.    Referring Provider Signature: _______________     Date : _____________________    Printed Name of Referring Provider: __________________________________________

## 2023-07-22 ENCOUNTER — Ambulatory Visit (HOSPITAL_COMMUNITY)
Admission: RE | Admit: 2023-07-22 | Discharge: 2023-07-22 | Disposition: A | Discharge status: Still In Hospital | Payer: Medicare (Managed Care) | Source: Ambulatory Visit

## 2023-07-22 NOTE — PT Treatment (Signed)
 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  Rates pain 0/10 at rest and 6/10 with any activity.  Sleep is disrupted at times.  Pt states he is doing exercise at home as advised but it does cause pain.      Objective:  activities as noted below.  Trial of Kinesiotape using 3 I tapes for deltoids and 2 I tapes for correction of forward humeral head and protracted scapula    Measured ROM:     EXERCISE/ACTIVITY NAME REPETITIONS RESISTANCE COMPLETED THIS DOS   HEP Review      Y   PROM L shoulder all planes     Y P!   Wand flexion supine     Y P!   Wand ER seated     Next   Wand ABD standing     Next   Pulley flexion  standing    Y P!   Gentle isometrics 4-way     Next          Assessment:   Pt tolerated all activities poorly.  He has pain behaviors with all mobility.  Compensatory mechanics noted with all elevation activities.      Short-Term Goals 2 Weeks:     - Patient will be independent with progressive HEP to maximize gains from PT.     - Patient will report less than max pain of 6/10 to aid with participation in physical therapy.     - Patient will exhibit proximal stability of the involved extremity to [facility] to permit normal posturing of the UE at rest.     - Patient will demonstrate improved left shoulder AROM to at least 105 degrees  to aid in functional reaching.           Long-Term Goals: 4 Weeks:     - Patient will improve functional ability with Patient Specific Functional Scale score of at least 5.     - Patient will demonstrate improved left shoulder AROM to at least 120 degrees flexion and ABD to aid in ability to perform ADLs independently.     - Patient will demonstrate improved left shoulder strength to at least 4/5 throughout to aid in return to previous level of function.     - Patient will exhibit WFL proximal stability of left shoulder girdle for pain free axial loading during transitions.     - Patient will report sleep not disrupted by shoulder pain to allow participation of functional ADLs during the day.          Plan:   Will monitor effects of tape and proceed accordingly    Total Session Time 30 and Timed code  minutes 30  THERAPEUTIC EXERCISE 30 minutes      [employer], PTA  07/22/2023, 08:39

## 2023-07-26 ENCOUNTER — Other Ambulatory Visit: Payer: Self-pay

## 2023-07-26 ENCOUNTER — Ambulatory Visit (HOSPITAL_COMMUNITY)
Admission: RE | Admit: 2023-07-26 | Discharge: 2023-07-26 | Disposition: A | Discharge status: Still In Hospital | Payer: Medicare (Managed Care) | Source: Ambulatory Visit

## 2023-07-26 NOTE — PT Treatment (Signed)
 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 Patient notes increased pain will all exercises and movements. Patient notes poor tolerance to PT interventions at this time. Patient notes limited relief of symptoms with KT tape.      Objective:  activities as noted below.      Measured ROM:      EXERCISE/ACTIVITY NAME REPETITIONS RESISTANCE COMPLETED THIS DOS   HEP Review      Y   PROM L shoulder all planes     Y P!   Wand flexion supine  5x   Y P!   Wand ER seated  5x   Y   Wand ABD standing     Next   Pulley flexion  standing    DNT   Gentle isometrics 4-way ABD/ADD/flex/ext   5x6"  Manual Resistance Y            Assessment:  Patient continues to demo poor tolerance to low level PT interventions. Patient notes fair tolerance to manual isometrics this date.      Short-Term Goals 2 Weeks:     - Patient will be independent with progressive HEP to maximize gains from PT.     - Patient will report less than max pain of 6/10 to aid with participation in physical therapy.     - Patient will exhibit proximal stability of the involved extremity to [facility] to permit normal posturing of the UE at rest.     - Patient will demonstrate improved left shoulder AROM to at least 105 degrees to aid in functional reaching.            Long-Term Goals: 4 Weeks:     - Patient will improve functional ability with Patient Specific Functional Scale score of at least 5.     - Patient will demonstrate improved left shoulder AROM to at least 120 degrees flexion and ABD to aid in ability to perform ADLs independently.     - Patient will demonstrate improved left shoulder strength to at least 4/5 throughout to aid in return to previous level of function.     - Patient will exhibit WFL proximal stability of left shoulder girdle for pain free axial loading during transitions.     - Patient will report sleep not disrupted by shoulder pain to allow participation of functional ADLs during the day.            Plan:   Continues as tolerated. Progress report visit 5    Total Session Time 30 and Timed code minutes 30  THERAPEUTIC EXERCISE 30 minutes      Doy Mince, PT  07/26/2023, 07:51

## 2023-07-28 ENCOUNTER — Other Ambulatory Visit: Payer: Self-pay

## 2023-07-28 ENCOUNTER — Ambulatory Visit
Admission: RE | Admit: 2023-07-28 | Discharge: 2023-07-28 | Disposition: A | Discharge status: Still In Hospital | Payer: Medicare (Managed Care) | Source: Ambulatory Visit | Attending: Student in an Organized Health Care Education/Training Program | Admitting: Student in an Organized Health Care Education/Training Program

## 2023-07-28 NOTE — PT Treatment (Signed)
 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 Unable to complete HEP fully d/t pain.      Objective:  activities as noted below.  Manual cuing for patient to disengage UT when performing sitting gear shift exercises. Patient is very guarded and limited with passive stretching d/t pain. Fair tol to MFR to L shoulder.     Measured ROM:      EXERCISE/ACTIVITY NAME REPETITIONS RESISTANCE COMPLETED THIS DOS   HEP Review      Y   PROM L shoulder all planes     Y P!   Wand flexion supine  5x   N P!   Wand ER seated  5x   Y   Wand ABD standing     Next   Pulley flexion  standing    DNT   Gentle isometrics 4-way ABD/ADD/flex/ext   5x6"  Manual Resistance Y   Seated wand gear shift (F/B) 15  Y            Assessment: Poor tolerance noted. Unable to tolerate a full 45 min session d/t pain.      Short-Term Goals 2 Weeks:     - Patient will be independent with progressive HEP to maximize gains from PT.     - Patient will report less than max pain of 6/10 to aid with participation in physical therapy.     - Patient will exhibit proximal stability of the involved extremity to [facility] to permit normal posturing of the UE at rest.     - Patient will demonstrate improved left shoulder AROM  to at least 105 degrees to aid in functional reaching.           Long-Term Goals: 4 Weeks:     - Patient will improve functional ability with Patient Specific Functional Scale score of at least 5.     - Patient will demonstrate improved left shoulder AROM to at least 120 degrees flexion and ABD to aid in ability to perform ADLs independently.     - Patient will demonstrate improved left shoulder strength to at least 4/5 throughout to aid in return to previous level of function.     - Patient will exhibit WFL proximal stability of left shoulder girdle for pain free axial loading during transitions.     - Patient will report sleep not disrupted by shoulder pain to allow participation of functional ADLs during the day.            Plan:   Progress Note next session.    Total Session Time 30, Timed code  minutes 30, and Untimed code minutes 0  THERAPEUTIC EXERCISE 15 minutes and JOINT MOBILIZATION/MFR 15 minutes      Laurence Aly, PTA  07/28/2023, 08:38

## 2023-08-02 ENCOUNTER — Ambulatory Visit (HOSPITAL_COMMUNITY)
Admission: RE | Admit: 2023-08-02 | Discharge: 2023-08-02 | Disposition: A | Discharge status: Still In Hospital | Payer: Self-pay | Source: Ambulatory Visit

## 2023-08-02 DIAGNOSIS — M75101 Unspecified rotator cuff tear or rupture of right shoulder, not specified as traumatic: Secondary | ICD-10-CM | POA: Insufficient documentation

## 2023-08-02 NOTE — PT Treatment (Signed)
 [facility]  Outpatient Physical Therapy  [address]  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  States exercise cause more pain for about 3-4 hours.  Notes no improvement in function noted.  Pt rates pain at worst 9/10 in past week.  Shoulder still disrupts sleep 3-4 times per night about 5 nights a week.  Still very painful to use LUE to push up to standing position and continues to be painful with any carrying.     Objective:     Measured ROM: Standing AROM  flexion 45 with shoulder shrug: Abduction 65 with shoulder shrug: ER 25 with altered mechanics: IR to L1;  All motions painful except IR, All motions with altered/compensatory mechanics      Patient-Specific Functional Score:     Problem Score  08/02/23   1. Reaching up in cabinet  6 6   2.Lifting a gallon of milk  0 0   TOTAL 3 3   Total score = sum of the activity scores/number of activities    Minimal detectable change (90% CI) for avg score = 2 points    Minimal detectable change (90% CI) for single activity score = 3 points          EXERCISE/ACTIVITY NAME REPETITIONS RESISTANCE COMPLETED THIS DOS   HEP Review      Y   PROM L shoulder all planes     N  P!   Wand flexion supine  5x   N P!   Wand ER seated  5x   n   Wand ABD standing     Next   Pulley flexion  standing    N P!   Gentle isometrics 4-way ABD/ADD/flex/ext   5x6"  On wall Y P!   Seated wand  gear shift (F/B) 15   Y        Assessment: Pt remains painful with all activities.  Sometimes is painful at rest.  Pain behaviors with all activities.  Pt has made no progress and at times is worse with exercsie     Short-Term Goals 2 Weeks:     - Patient will be independent with progressive HEP to maximize gains from PT.     - Patient will report less than max pain of 6/10 to aid with participation in physical therapy.     - Patient will exhibit proximal stability of the involved extremity to [facility] to permit normal posturing of the UE at rest.     - Patient will demonstrate improved left shoulder AROM to at least 105 degrees to aid in functional reaching.           Long-Term Goals: 4 Weeks:     - Patient will improve functional ability with Patient Specific Functional Scale score of at least  5.     - Patient will demonstrate improved left shoulder AROM to at least 120 degrees flexion and ABD to aid in ability to perform ADLs independently.     - Patient will demonstrate improved left shoulder strength to at least 4/5 throughout to aid in return to previous level of function.     - Patient will exhibit WFL proximal stability of left shoulder girdle for pain free axial loading during transitions.     - Patient will report sleep not disrupted by shoulder pain to allow participation of functional ADLs during the day.             Plan:  PT will reassess pt next visit for lack of progress    Total Session Time 30 and Timed code minutes 30  THERAPEUTIC EXERCISE 30 minutes      Robina Hamor, PTA  08/02/2023, 10:20

## 2023-08-04 ENCOUNTER — Ambulatory Visit (HOSPITAL_COMMUNITY): Payer: Self-pay

## 2023-08-09 ENCOUNTER — Other Ambulatory Visit: Payer: Self-pay

## 2023-08-09 ENCOUNTER — Ambulatory Visit
Admission: RE | Admit: 2023-08-09 | Discharge: 2023-08-09 | Disposition: A | Discharge status: Still In Hospital | Payer: Self-pay | Source: Ambulatory Visit | Attending: Student in an Organized Health Care Education/Training Program | Admitting: Student in an Organized Health Care Education/Training Program

## 2023-08-09 NOTE — PT Treatment (Signed)
 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 Patient reports 0% improvement of symptoms since [facility]. Patient is agreeable to D/C this date due to lack of progress with PT interventions. Patient reports he has FU with Dr. Alyson Locket on 08/17/23.      Objective:     Patient-Specific Functional Score:     Problem Score  08/02/23 3/11   1. Reaching up in cabinet  6 6 6    2.Lifting a gallon of milk  0 0 0   TOTAL 3 3 3    Total score = sum of the activity scores/number of activities    Minimal detectable change (90% CI) for avg score = 2 points    Minimal detectable change (90% CI) for single activity score = 3 points              Shoulder AROM    Right IE  Left IE  Left 3/11   Flexion 80 90 80   Extension 60 40 40   Abduction 75 55 56   ER C7 C7 Occiput   IR Iliac Crest L4  L4   ROM comment: Patient continues to report significant shoulder pain with all left shoulder movements.      Strength (defined as __/5 based on 0/5 - 5/5 grading system)       Right IE  Left IE  Left 3/11   Shoulder flexion 3+/5 3+/5 3/5   Shoulder abduction  3+/5 3+/5 3/5   Shoulder IR 4-/5 3/5 3+/5   Shoulder ER 4-/5 3/5 3/5   Elbow flexion  4-/5 3+/5 4+/5   Elbow extension  4-/5 3+/5  4+/5      Strength comment: Patient continues to note  significant right shoulder pain with all MMT.     EXERCISE/ACTIVITY NAME REPETITIONS RESISTANCE COMPLETED THIS DOS   HEP Review      Y   PROM L shoulder all planes     N     Wand flexion supine  5x   N  p!   Wand ER seated  5x   N p!   Wand ABD standing     N p!    Pulley flexion  standing    N    Gentle isometrics 4-way ABD/ADD/flex/ext   5x6"  On wall N P!   Seated wand gear shift (F/B) 15   N   Objective reassessment   Y         Assessment: Patient has made poor progress towards established short and long term PT goals at this time. Patient has not demonstrated ability to tolerate PT interventions at this time. Patient notes significant pain/difficulty with  low level PT exercises and PROM. Patient has made limited to no improvements to left shoulder AROM or gross muscle strength. Patient denies any improvements per PSFS. At this time, patient is not appropriate for PT treatment. Recommend referral back to Dr. Alyson Locket for further medical management of left shoulder pain. Patient is agreeable to D/C at this time and verbalizes understanding/agreement.        Short-Term Goals 2 Weeks:     - Patient will be independent with progressive HEP to maximize gains from PT. ( MET 08/09/23 )     - Patient will report less than max pain of 6/10 to aid with participation in physical therapy. ( NOT MET 08/09/23 )     - Patient will exhibit proximal stability of the involved extremity to [facility] to permit normal posturing of the UE at rest. ( NOT MET 08/09/23 )     - Patient will demonstrate improved left shoulder AROM to at least 105 degrees to aid in functional reaching. ( NOT MET 08/09/23 )           Long-Term Goals: 4 Weeks:     - Patient will improve functional ability with Patient Specific Functional Scale score of at least 5.  (NOT MET 08/09/23 )     - Patient will demonstrate improved left shoulder AROM to at least 120 degrees flexion and ABD to aid in ability to perform ADLs independently. ( NOT MET 08/09/23 )     - Patient will  demonstrate improved left shoulder strength to at least 4/5 throughout to aid in return to previous level of function. ( NOT MET 08/09/23 )     - Patient will exhibit WFL proximal stability of left shoulder girdle for pain free axial loading during transitions. ( NOT MET 08/09/23 )     - Patient will report sleep not disrupted by shoulder pain to allow participation of functional ADLs during the day. ( NOT MET 08/09/23 )               Plan:  D/C from PT at this time due to lack of progress and poor tolerance. Refer back to Dr. Alyson Locket for further medical management of symptoms.     Total Session Time 35 and Timed code minutes 35  THERAPEUTIC EXERCISE 35 minutes      Doy Mince, PT  08/09/2023, 15:02

## 2024-02-13 ENCOUNTER — Other Ambulatory Visit (HOSPITAL_COMMUNITY): Payer: Self-pay | Admitting: FAMILY PRACTICE

## 2024-02-13 ENCOUNTER — Ambulatory Visit
Admission: RE | Admit: 2024-02-13 | Discharge: 2024-02-13 | Disposition: A | Discharge status: Home / Self Care | Source: Ambulatory Visit | Attending: FAMILY PRACTICE

## 2024-02-13 ENCOUNTER — Ambulatory Visit (HOSPITAL_BASED_OUTPATIENT_CLINIC_OR_DEPARTMENT_OTHER)
Admission: RE | Admit: 2024-02-13 | Discharge: 2024-02-13 | Disposition: A | Discharge status: Home / Self Care | Source: Ambulatory Visit

## 2024-02-13 ENCOUNTER — Other Ambulatory Visit: Payer: Self-pay

## 2024-02-13 DIAGNOSIS — R634 Abnormal weight loss: Secondary | ICD-10-CM | POA: Insufficient documentation

## 2024-02-13 DIAGNOSIS — Z87891 Personal history of nicotine dependence: Secondary | ICD-10-CM

## 2024-02-13 DIAGNOSIS — K5900 Constipation, unspecified: Secondary | ICD-10-CM

## 2024-03-29 ENCOUNTER — Encounter (INDEPENDENT_AMBULATORY_CARE_PROVIDER_SITE_OTHER): Payer: Self-pay | Admitting: Surgery

## 2024-03-31 NOTE — H&P (Unsigned)
 GENERAL SURGERY, [facility]  [address]  [place] [address]       Name: Brandon Fuller MRN:  [id number]   Date: 04/02/2024 Age: 68 y.o. [birth year]      PCP: [facility]     Subjective  Brandon Fuller is a 68 y.o. year old male who presents for screening colonoscopy.  No current GI complaints.  No constipation.  No abdominal pain.  No rectal bleeding.  No unexplained weight loss.      Family history of colon cancer:  ***    Last colonoscopy:  ***    Patient's referral to this office included a recent assessment by the referring provider.  This was reviewed by me for this unique office visit for the indication and intent of the referral as well as any pertinent medical or surgical history relevant to the patient's independent evaluation by me today.     Allergies[1]   Current Outpatient Medications   Medication Sig    finasteride (PROSCAR) 5 mg Oral Tablet Take 1 Tablet (5 mg total) by mouth Once a day    omeprazole (PRILOSEC) 20 mg Oral Capsule, Delayed Release(E.C.) Take 1 Capsule (20 mg total) by mouth Once a day    tadalafil (CIALIS) 5 mg Oral Tablet Take 1 Tablet (5 mg total) by mouth Every 24 hours as needed    terazosin (HYTRIN) 5 mg Oral Capsule Take 1 Capsule (5 mg total) by mouth Every night          Objective:     There were no vitals filed for this visit.     General: appropriate for age. in no acute distress.    Vital signs are present above and have been reviewed by me     HEENT: Atraumatic, Normocephalic.    Lungs: Nonlabored breathing with symmetric expansion    Heart:Regular wth respect to rate and rythmn.    Abdomen:Soft. Nontender. Nondistended     Psychiatric: Alert and oriented to person, place, and time. affect appropriate    Assessment/Plan  No diagnosis found.     Discussed indications, risks, and benefits of colonoscopy with the patient.  Discussed the possibility of polypectomy, biopsies, and repeat possible examinations.  Risks discussed  include bleeding, sedation risks, possibility of missed diagnosis of polyp malignancy, and remote possibility of perforation and/or death.  All questions answered and informed consent clearly obtained.    Office Visit was used for detailed explanation procedure and its indications, review of the patient's medications relative to the time before and after the procedure, and the effects of the associated medical conditions that affect the procedure preparation and procedure itself.    Alm DELENA Nam MD MBA CPE FACS          [1] No Known Allergies

## 2024-04-02 ENCOUNTER — Encounter (INDEPENDENT_AMBULATORY_CARE_PROVIDER_SITE_OTHER): Payer: Self-pay | Admitting: Surgery

## 2024-04-02 ENCOUNTER — Ambulatory Visit (INDEPENDENT_AMBULATORY_CARE_PROVIDER_SITE_OTHER): Payer: Self-pay | Admitting: Surgery

## 2024-04-02 ENCOUNTER — Other Ambulatory Visit: Payer: Self-pay

## 2024-04-02 VITALS — BP 109/66 | HR 64 | Temp 98.1°F | Wt 178.0 lb

## 2024-04-02 DIAGNOSIS — Z860100 Personal history of colon polyps, unspecified: Secondary | ICD-10-CM

## 2024-04-02 DIAGNOSIS — Z01818 Encounter for other preprocedural examination: Secondary | ICD-10-CM

## 2024-04-02 DIAGNOSIS — Z800 Family history of malignant neoplasm of digestive organs: Secondary | ICD-10-CM

## 2024-04-02 MED ORDER — PEG 3350-ELECTROLYTES 236 GRAM-22.74 GRAM-6.74 GRAM-5.86 GRAM SOLUTION
4.0000 L | Freq: Once | ORAL | 0 refills | Status: DC
Start: 2024-04-02 — End: 2024-04-12

## 2024-04-02 NOTE — Addendum Note (Signed)
 Addended by: WADDELL NORRIS R on: 04/02/2024 10:55 AM     Modules accepted: Orders

## 2024-04-02 NOTE — Nursing Note (Signed)
 Genetic test obtained today. Confirmation # CPU [id number]. Robie Revere, LPN  88/10/7972 89:84

## 2024-04-12 ENCOUNTER — Encounter (HOSPITAL_COMMUNITY): Payer: Self-pay | Admitting: Surgery

## 2024-04-12 ENCOUNTER — Ambulatory Visit (HOSPITAL_COMMUNITY)

## 2024-04-12 ENCOUNTER — Encounter (HOSPITAL_COMMUNITY)
Admission: RE | Disposition: A | Discharge status: Home / Self Care | Payer: Self-pay | Source: Ambulatory Visit | Attending: Surgery

## 2024-04-12 ENCOUNTER — Other Ambulatory Visit: Payer: Self-pay

## 2024-04-12 ENCOUNTER — Ambulatory Visit (HOSPITAL_COMMUNITY): Admitting: Surgery

## 2024-04-12 ENCOUNTER — Ambulatory Visit
Admission: RE | Admit: 2024-04-12 | Discharge: 2024-04-12 | Disposition: A | Discharge status: Home / Self Care | Source: Ambulatory Visit | Attending: Surgery | Admitting: Surgery

## 2024-04-12 DIAGNOSIS — K648 Other hemorrhoids: Secondary | ICD-10-CM | POA: Insufficient documentation

## 2024-04-12 DIAGNOSIS — Z860100 Personal history of colon polyps, unspecified: Secondary | ICD-10-CM | POA: Insufficient documentation

## 2024-04-12 DIAGNOSIS — Z800 Family history of malignant neoplasm of digestive organs: Secondary | ICD-10-CM | POA: Insufficient documentation

## 2024-04-12 DIAGNOSIS — Z1211 Encounter for screening for malignant neoplasm of colon: Secondary | ICD-10-CM | POA: Insufficient documentation

## 2024-04-12 SURGERY — COLONOSCOPY
Anesthesia: General | Wound class: Clean Contaminated Wounds-The respiratory, GI, Genital, or urinary

## 2024-04-12 MED ORDER — PROPOFOL 10 MG/ML INTRAVENOUS EMULSION
INTRAVENOUS | Status: DC | PRN
Start: 2024-04-12 — End: 2024-04-12
  Administered 2024-04-12: 200 ug/kg/min via INTRAVENOUS
  Administered 2024-04-12: 0 ug/kg/min via INTRAVENOUS

## 2024-04-12 MED ORDER — LACTATED RINGERS INTRAVENOUS SOLUTION
INTRAVENOUS | Status: DC | PRN
Start: 2024-04-12 — End: 2024-04-12

## 2024-04-12 MED ORDER — LIDOCAINE (PF) 100 MG/5 ML (2 %) INTRAVENOUS SYRINGE
INJECTION | Freq: Once | INTRAVENOUS | Status: DC | PRN
Start: 2024-04-12 — End: 2024-04-12
  Administered 2024-04-12: 100 mg via INTRAVENOUS

## 2024-04-12 SURGICAL SUPPLY — 1 items: CLEANER INSTRUMENT PRE-KLENZ 13.5 OZ (MISCELLANEOUS PT CARE ITEMS) ×1 IMPLANT

## 2024-04-12 NOTE — OR Surgeon (Signed)
 [facility]      Patient Name: [name], [name] Number: [id number]  Date of Service: 04/12/2024   Date of Birth: 1956-01-29      Pre-Operative Diagnosis: Family history of colon cancer [Z80.0]     Post-Operative Diagnosis: internal hemorrhoids    Procedure(s)/Description:  COLONOSCOPY: 54621 (CPT)       Attending Surgeon: Alm Nam, MD     Anesthesia:  CRNA: Awilda Norris, CRNA    Anesthesia Type: .General     Specimen: none    Patient was taken to the endoscopy suite and given appropriate intravenous sedation.  Videocolonoscope was inserted into the rectum and advanced sequentially to the level of the cecum.  Cecum was confirmed by external palpation, presence of the ileocecal valve and transillumination of the light.  Picture was taken that documents this level.  Colonoscope was then subsequently withdrawn back inspecting all mucosal surfaces.  The ascending colon, transverse colon, descending colon, and sigmoid colon were all visualized with no specific abnormality.  The scope was withdrawn back to the level of the rectum and retroflexed.  The rectal anal junction visualized with internal hemorrhoids and no other specific abnormality.  Scope was then subsequently straightened and withdrawn.  This concluded the procedure which patient tolerated well.    Alm DELENA Nam MD MBA CPE FACS

## 2024-04-12 NOTE — Anesthesia Transfer of Care (Signed)
 ANESTHESIA TRANSFER OF CARE   Brandon Fuller is a 68 y.o. ,male, Weight: 80.3 kg (177 lb)   had Procedure(s):  COLONOSCOPY  performed  04/12/2024   Primary Service: Alm Nam, MD    Past Medical History:   Diagnosis Date    DDD (degenerative disc disease), lumbar     Esophageal reflux     Prostate disease     Vitamin B12 deficiency       Allergy History as of 04/12/24        No Known Allergies                  I completed my transfer of care / handoff to the receiving personnel during which we discussed:  Access, Airway, All key/critical aspects of case discussed, Analgesia, Antibiotics, Expectation of post procedure, Fluids/Product, Gave opportunity for questions and acknowledgement of understanding, Labs and PMHx      Post Location: PACU                                                           Last OR Temp: Temperature: 36.3 C (97.3 F)      Airway:* No LDAs found *  Blood pressure 95/71, pulse 73, temperature 36.3 C (97.3 F), resp. rate 17, height 1.803 m (5' 11), weight 80.3 kg (177 lb), SpO2 96%.

## 2024-04-12 NOTE — Anesthesia Preprocedure Evaluation (Signed)
 ANESTHESIA PRE-OP EVALUATION  Planned Procedure: COLONOSCOPY  Review of Systems     anesthesia history negative     patient summary reviewed  nursing notes reviewed        Pulmonary  negative pulmonary ROS,    Cardiovascular  negative cardio ROS,   ECG reviewed ,No peripheral edema,  Exercise Tolerance: > or = 4 METS        GI/Hepatic/Renal    GERD (pt states he has been symptom free for years) and well controlled        Endo/Other   neg endo/other ROS,       Neuro/Psych/MS   negative neuro/psych ROS,      Cancer    negative hematology/oncology ROS,                   Physical Assessment      Airway       Mallampati: II    TM distance: >3 FB    Neck ROM: full  Mouth Opening: good.  Facial hair  Beard        Dental           (+) edentulous, upper dentures           Pulmonary    Breath sounds clear to auscultation  (-) no rhonchi, no decreased breath sounds, no wheezes, no rales and no stridor     Cardiovascular    Rhythm: regular  Rate: Normal  (-) no friction rub, carotid bruit is not present, no peripheral edema and no murmur     Other findings            Plan  ASA 2     Planned anesthesia type: general     total intravenous anesthesia                    Intravenous induction       Anesthetic plan and risks discussed with patient  signed consent obtained      Use of blood products discussed with patient who consented to blood products.      Patient's NPO status is appropriate for Anesthesia.

## 2024-04-12 NOTE — Interval H&P Note (Signed)
 [facility]      H&P UPDATE FORM                                                                                  Brandon Fuller, Brandon Fuller, 68 y.o. male  Date of Admission:  04/12/2024  Date of Birth:  23-Aug-1955    04/12/2024    STOP: IF H&P IS GREATER THAN 30 DAYS FROM SURGICAL DAY COMPLETE NEW H&P IS REQUIRED.     H & P updated the day of the procedure.  1.  H&P completed within 30 days of surgical procedure and has been reviewed within 24 hours of admission but prior to surgery or a procedure requiring anesthesia services, the patient has been examined, and no change has occured in the patients condition since the H&P was completed.       Change in medications: No              Comments:     2.  Patient continues to be appropriate candidate for planned surgical procedure. YES    Alm Nam, MD

## 2024-04-12 NOTE — Anesthesia Postprocedure Evaluation (Signed)
 Anesthesia Post Op Evaluation    Patient: Brandon Fuller  Procedure(s):  COLONOSCOPY    Last Vitals:Temperature: 36.3 C (97.3 F) (04/12/24 1137)  Heart Rate: 73 (04/12/24 1137)  BP (Non-Invasive): 95/71 (04/12/24 1137)  Respiratory Rate: 17 (04/12/24 1137)  SpO2: 96 % (04/12/24 1137)    No notable events documented.    Patient is sufficiently recovered from the effects of anesthesia to participate in the evaluation and has returned to their pre-procedure level.  Patient location during evaluation: PACU       Patient participation: complete - patient participated  Level of consciousness: awake and alert and responsive to verbal stimuli    Pain management: adequate  Airway patency: patent    Anesthetic complications: no  Cardiovascular status: acceptable  Respiratory status: acceptable  Hydration status: acceptable  Patient post-procedure temperature: Pt Normothermic   PONV Status: Absent

## 2024-04-12 NOTE — Discharge Instructions (Addendum)
 SURGICAL DISCHARGE INSTRUCTIONS     Dr. Steen Lenis, MD  performed your COLONOSCOPY today at the [facility]    [place]  [facility]:  Monday through Friday from 8 a.m. - 4 p.m.: [phone]    For T&D: [phone]  Between 4 p.m. - 8 a.m., weekends and holidays:  Call ER [phone]    PLEASE SEE WRITTEN HANDOUTS AS DISCUSSED BY YOUR NURSE.    SIGNS AND SYMPTOMS OF A WOUND / INCISION INFECTION   Be sure to watch for the following:  Increase in redness or red streaks near or around the wound or incision.  Increase in pain that is intense or severe and cannot be relieved by the pain medication that your doctor has given you.  Increase in swelling that cannot be relieved by elevation of a body part, or by applying ice, if permitted.  Increase in drainage, or if yellow / green in color and smells bad. This could be on a dressing or a cast.  Increase in fever for longer than 24 hours, or an increase that is higher than 101 degrees Fahrenheit (normal body temperature is 98 degrees Fahrenheit). The incision may feel warm to the touch.    **CALL YOUR DOCTOR IF ONE OR MORE OF THESE SIGNS / SYMPTOMS SHOULD OCCUR.    ANESTHESIA INFORMATION   ANESTHESIA -- ADULT PATIENTS:  You have received intravenous sedation / general anesthesia, and you may feel drowsy and light-headed for several hours. You may even experience some forgetfulness of the procedure. DO NOT DRIVE A MOTOR VEHICLE or perform any activity requiring complete alertness or coordination until you feel fully awake in about 24-48 hours. Do not drink alcoholic beverages for at least 24 hours. Do not stay alone, you must have a responsible adult available to be with you. You may also experience a dry mouth or nausea for 24 hours. This is a normal side effect and will disappear as the effects of the medication wear off.    REMEMBER   If you experience any difficulty breathing, chest pain, bleeding that you feel is excessive,  persistent nausea or vomiting or for any other concerns:  Call your physician Dr.  Steen Lenis, MD   at [phone] . You may also ask to have the general doctor on call paged. They are available to you 24 hours a day.    SPECIAL INSTRUCTIONS / COMMENTS   NO DRIVING, HEAVY LIFTING, SIGNING ANY LEGAL DOCUMENTS, COOKING OR CLEANING TODAY.  MAY RESUME NORMAL ACTIVITY TOMORROW.  FINDINGS TODAY ARE INTERNAL HEMORRHOIDS.    FOLLOW-UP APPOINTMENTS   Please call your surgeon's office at the number listed to schedule a date / time of return for follow-up.     Dr Lenis Steen [phone]

## 2024-04-13 ENCOUNTER — Telehealth (INDEPENDENT_AMBULATORY_CARE_PROVIDER_SITE_OTHER): Payer: Self-pay | Admitting: Surgery
